# Patient Record
Sex: Male | Born: 1945 | Race: Black or African American | Hispanic: No | Marital: Married | Smoking: Former smoker
Health system: Southern US, Community
[De-identification: ages and names within clinical notes are randomized; demographics above are authoritative.]

## PROBLEM LIST (undated history)

## (undated) DIAGNOSIS — M549 Dorsalgia, unspecified: Secondary | ICD-10-CM

## (undated) DIAGNOSIS — E119 Type 2 diabetes mellitus without complications: Secondary | ICD-10-CM

## (undated) DIAGNOSIS — K219 Gastro-esophageal reflux disease without esophagitis: Secondary | ICD-10-CM

## (undated) DIAGNOSIS — I10 Essential (primary) hypertension: Secondary | ICD-10-CM

## (undated) DIAGNOSIS — L409 Psoriasis, unspecified: Secondary | ICD-10-CM

## (undated) DIAGNOSIS — E7800 Pure hypercholesterolemia, unspecified: Secondary | ICD-10-CM

## (undated) DIAGNOSIS — I2510 Atherosclerotic heart disease of native coronary artery without angina pectoris: Secondary | ICD-10-CM

## (undated) DIAGNOSIS — M1990 Unspecified osteoarthritis, unspecified site: Secondary | ICD-10-CM

## (undated) DIAGNOSIS — F4310 Post-traumatic stress disorder, unspecified: Secondary | ICD-10-CM

## (undated) HISTORY — DX: Unspecified osteoarthritis, unspecified site: M19.90

## (undated) HISTORY — DX: Gastro-esophageal reflux disease without esophagitis: K21.9

## (undated) HISTORY — PX: ELBOW SURGERY: SHX618

## (undated) HISTORY — DX: Psoriasis, unspecified: L40.9

## (undated) HISTORY — DX: Atherosclerotic heart disease of native coronary artery without angina pectoris: I25.10

## (undated) HISTORY — PX: WRIST SURGERY: SHX841

## (undated) HISTORY — DX: Post-traumatic stress disorder, unspecified: F43.10

---

## 1968-11-18 HISTORY — PX: FEMUR FRACTURE SURGERY: SHX633

## 2015-12-26 ENCOUNTER — Emergency Department (HOSPITAL_COMMUNITY): Payer: Medicare Other

## 2015-12-26 ENCOUNTER — Emergency Department (HOSPITAL_COMMUNITY)
Admission: EM | Admit: 2015-12-26 | Discharge: 2015-12-26 | Disposition: A | Discharge status: Home / Self Care | Payer: Medicare Other | Attending: Emergency Medicine | Admitting: Emergency Medicine

## 2015-12-26 ENCOUNTER — Encounter (HOSPITAL_COMMUNITY): Payer: Self-pay

## 2015-12-26 DIAGNOSIS — R05 Cough: Secondary | ICD-10-CM | POA: Diagnosis present

## 2015-12-26 DIAGNOSIS — Z791 Long term (current) use of non-steroidal anti-inflammatories (NSAID): Secondary | ICD-10-CM | POA: Insufficient documentation

## 2015-12-26 DIAGNOSIS — J069 Acute upper respiratory infection, unspecified: Secondary | ICD-10-CM | POA: Insufficient documentation

## 2015-12-26 DIAGNOSIS — R059 Cough, unspecified: Secondary | ICD-10-CM

## 2015-12-26 DIAGNOSIS — I10 Essential (primary) hypertension: Secondary | ICD-10-CM | POA: Insufficient documentation

## 2015-12-26 DIAGNOSIS — E119 Type 2 diabetes mellitus without complications: Secondary | ICD-10-CM | POA: Insufficient documentation

## 2015-12-26 DIAGNOSIS — Z79899 Other long term (current) drug therapy: Secondary | ICD-10-CM | POA: Insufficient documentation

## 2015-12-26 HISTORY — DX: Type 2 diabetes mellitus without complications: E11.9

## 2015-12-26 HISTORY — DX: Essential (primary) hypertension: I10

## 2015-12-26 HISTORY — DX: Dorsalgia, unspecified: M54.9

## 2015-12-26 LAB — BASIC METABOLIC PANEL
ANION GAP: 10 (ref 5–15)
BUN: 19 mg/dL (ref 6–20)
CHLORIDE: 100 mmol/L — AB (ref 101–111)
CO2: 29 mmol/L (ref 22–32)
Calcium: 9.7 mg/dL (ref 8.9–10.3)
Creatinine, Ser: 1.18 mg/dL (ref 0.61–1.24)
GFR calc non Af Amer: >60 mL/min (ref >60)
Glucose, Bld: 138 mg/dL — ABNORMAL HIGH (ref 65–99)
POTASSIUM: 4.4 mmol/L (ref 3.5–5.1)
SODIUM: 139 mmol/L (ref 135–145)

## 2015-12-26 LAB — CBC
HCT: 41.6 % (ref 39.0–52.0)
HEMOGLOBIN: 14.3 g/dL (ref 13.0–17.0)
MCH: 33.3 pg (ref 26.0–34.0)
MCHC: 34.4 g/dL (ref 30.0–36.0)
MCV: 97 fL (ref 78.0–100.0)
Platelets: 119 10*3/uL — ABNORMAL LOW (ref 150–400)
RBC: 4.29 MIL/uL (ref 4.22–5.81)
RDW: 12.6 % (ref 11.5–15.5)
WBC: 6.4 10*3/uL (ref 4.0–10.5)

## 2015-12-26 MED ORDER — HYDROCOD POLST-CPM POLST ER 10-8 MG/5ML PO SUER: 5.0000 mL | Freq: Two times a day (BID) | ORAL | Status: DC | PRN

## 2015-12-26 NOTE — ED Provider Notes (Signed)
CSN: [id number]     Arrival date & time 12/26/15  1191 History  By signing my name below, I, Murriel Hopper, attest that this documentation has been prepared under the direction and in the presence of Linwood Dibbles, MD. Electronically Signed: Murriel Hopper, ED Scribe. 12/26/2015. 10:10 AM.    Chief Complaint  Patient presents with  . Cough     Patient is a 70 y.o. male presenting with cough. The history is provided by the patient. No language interpreter was used.  Cough  HPI Comments: Chad Perry is a 70 y.o. male who presents to the Emergency Department complaining of a constant, productive cough that has been present since yesterday. Pt reports he also has associated body aches and chills. Pt states he had a flu shot this year. Pt denies taking any medication to treat his symptoms. Pt denies fever, ear pain, sore throat, vomiting, diarrhea.  Past Medical History  Diagnosis Date  . Diabetes mellitus without complication (HCC)   . Hypertension   . Back pain    Past Surgical History  Procedure Laterality Date  . Knee surgery    . Femur fracture surgery    . Elbow surgery    . Wrist surgery     No family history on file. Social History  Substance Use Topics  . Smoking status: Never Smoker   . Smokeless tobacco: None  . Alcohol Use: No    Review of Systems  Respiratory: Positive for cough.     A complete 10 system review of systems was obtained and all systems are negative except as noted in the HPI and PMH.    Allergies  Review of patient's allergies indicates no known allergies.  Home Medications   Prior to Admission medications   Medication Sig Start Date End Date Taking? Authorizing Provider  aspirin-sod bicarb-citric acid (ALKA-SELTZER) 325 MG TBEF tablet Take 325 mg by mouth every 6 (six) hours as needed.   Yes Historical Provider, MD  FLUoxetine (PROZAC) 20 MG capsule Take 20 mg by mouth daily.   Yes Historical Provider, MD  lisinopril (PRINIVIL,ZESTRIL) 40 MG tablet  Take 40 mg by mouth daily.   Yes Historical Provider, MD  metFORMIN (GLUCOPHAGE) 500 MG tablet Take 500 mg by mouth 1 day or 1 dose.   Yes Historical Provider, MD  omeprazole (PRILOSEC) 20 MG capsule Take 20 mg by mouth daily.   Yes Historical Provider, MD  sulindac (CLINORIL) 200 MG tablet Take 200 mg by mouth 2 (two) times daily.   Yes Historical Provider, MD  tamsulosin (FLOMAX) 0.4 MG CAPS capsule Take 0.4 mg by mouth.   Yes Historical Provider, MD  traMADol (ULTRAM) 50 MG tablet Take 50 mg by mouth every 6 (six) hours as needed.   Yes Historical Provider, MD  traZODone (DESYREL) 100 MG tablet Take 200 mg by mouth at bedtime.   Yes Historical Provider, MD  triamterene-hydrochlorothiazide (MAXZIDE) 75-50 MG tablet Take 0.5 tablets by mouth daily.   Yes Historical Provider, MD  chlorpheniramine-HYDROcodone (TUSSIONEX PENNKINETIC ER) 10-8 MG/5ML SUER Take 5 mLs by mouth every 12 (twelve) hours as needed for cough. 12/26/15   Linwood Dibbles, MD   BP 138/72 mmHg  Pulse 93  Temp(Src) 98.7 F (37.1 C) (Oral)  Resp 17  Ht  (1.676 m)  Wt 96.163 kg  BMI 34.23 kg/m2  SpO2 95% Physical Exam  Constitutional: He appears well-developed and well-nourished. No distress.  HENT:  Head: Normocephalic and atraumatic.  Right Ear: External ear normal.  Left Ear: External ear normal.  Eyes: Conjunctivae are normal. Right eye exhibits no discharge. Left eye exhibits no discharge. No scleral icterus.  Neck: Neck supple. No tracheal deviation present.  Cardiovascular: Normal rate, regular rhythm and intact distal pulses.   Pulmonary/Chest: Effort normal and breath sounds normal. No stridor. No respiratory distress. He has no wheezes. He has no rales.  Abdominal: Soft. Bowel sounds are normal. He exhibits no distension. There is no tenderness. There is no rebound and no guarding.  Musculoskeletal: He exhibits no edema or tenderness.  Neurological: He is alert. He has normal strength. No cranial nerve deficit  (no facial droop, extraocular movements intact, no slurred speech) or sensory deficit. He exhibits normal muscle tone. He displays no seizure activity. Coordination normal.  Skin: Skin is warm and dry. No rash noted.  Psychiatric: He has a normal mood and affect.  Nursing note and vitals reviewed.   ED Course  Procedures (including critical care time)  DIAGNOSTIC STUDIES: Oxygen Saturation is 95% on room air, normal by my interpretation.    COORDINATION OF CARE: 10:10 AM Discussed treatment plan with pt at bedside and pt agreed to plan.   Labs Review Labs Reviewed  CBC - Abnormal; Notable for the following:    Platelets 119 (*)    All other components within normal limits  BASIC METABOLIC PANEL - Abnormal; Notable for the following:    Chloride 100 (*)    Glucose, Bld 138 (*)    All other components within normal limits    Imaging Review Dg Chest 2 View  12/26/2015  CLINICAL DATA:  Cough. They all over as well as chills. History of hypertension. Former smoker. EXAM: CHEST  2 VIEW COMPARISON:  None. FINDINGS: Cardiac silhouette is normal in size and configuration. Normal mediastinal and hilar contours. Mild scarring noted in the left upper lobe lingula. Lungs are otherwise clear. No pleural effusion or pneumothorax. Bony thorax is intact. IMPRESSION: No active cardiopulmonary disease. Electronically Signed   By: Amie Portland M.D.   On: 12/26/2015 10:27   I have personally reviewed and evaluated these images and lab results as part of my medical decision-making.   MDM   Final diagnoses:  Cough  URI (upper respiratory infection)    Symptoms are most likely related to a viral infection. Patient appears well. Chest x-ray does not show any evidence of pneumonia. Laboratory tests are unremarkable.  Plan discharge home with a cough medication for supportive care. Follow up with his doctor next week if the symptoms persist. Return for  worsening symptoms or shortness of  breath   Linwood Dibbles, MD 12/26/15 1144

## 2015-12-26 NOTE — ED Notes (Signed)
Pt reports nonproductive cough, generalized body aches, and chills since yesterday.

## 2015-12-26 NOTE — Discharge Instructions (Signed)

## 2016-03-25 ENCOUNTER — Encounter (HOSPITAL_COMMUNITY): Payer: Self-pay | Admitting: Emergency Medicine

## 2016-03-25 ENCOUNTER — Emergency Department (HOSPITAL_COMMUNITY): Payer: Medicare Other

## 2016-03-25 ENCOUNTER — Emergency Department (HOSPITAL_COMMUNITY)
Admission: EM | Admit: 2016-03-25 | Discharge: 2016-03-25 | Disposition: A | Discharge status: Home / Self Care | Payer: Medicare Other | Attending: Emergency Medicine | Admitting: Emergency Medicine

## 2016-03-25 DIAGNOSIS — R51 Headache: Secondary | ICD-10-CM | POA: Diagnosis present

## 2016-03-25 DIAGNOSIS — E119 Type 2 diabetes mellitus without complications: Secondary | ICD-10-CM | POA: Insufficient documentation

## 2016-03-25 DIAGNOSIS — I10 Essential (primary) hypertension: Secondary | ICD-10-CM | POA: Diagnosis not present

## 2016-03-25 DIAGNOSIS — Z79899 Other long term (current) drug therapy: Secondary | ICD-10-CM | POA: Insufficient documentation

## 2016-03-25 DIAGNOSIS — Z7984 Long term (current) use of oral hypoglycemic drugs: Secondary | ICD-10-CM | POA: Insufficient documentation

## 2016-03-25 DIAGNOSIS — R519 Headache, unspecified: Secondary | ICD-10-CM

## 2016-03-25 DIAGNOSIS — Z7982 Long term (current) use of aspirin: Secondary | ICD-10-CM | POA: Diagnosis not present

## 2016-03-25 HISTORY — DX: Pure hypercholesterolemia, unspecified: E78.00

## 2016-03-25 LAB — COMPREHENSIVE METABOLIC PANEL
ALT: 33 U/L (ref 17–63)
AST: 36 U/L (ref 15–41)
Albumin: 4.2 g/dL (ref 3.5–5.0)
Alkaline Phosphatase: 54 U/L (ref 38–126)
Anion gap: 7 (ref 5–15)
BUN: 20 mg/dL (ref 6–20)
CHLORIDE: 105 mmol/L (ref 101–111)
CO2: 29 mmol/L (ref 22–32)
CREATININE: 1.38 mg/dL — AB (ref 0.61–1.24)
Calcium: 9.9 mg/dL (ref 8.9–10.3)
GFR, EST AFRICAN AMERICAN: 59 mL/min — AB (ref >60)
GFR, EST NON AFRICAN AMERICAN: 51 mL/min — AB (ref >60)
Glucose, Bld: 133 mg/dL — ABNORMAL HIGH (ref 65–99)
POTASSIUM: 4.6 mmol/L (ref 3.5–5.1)
SODIUM: 141 mmol/L (ref 135–145)
Total Bilirubin: 0.4 mg/dL (ref 0.3–1.2)
Total Protein: 7.8 g/dL (ref 6.5–8.1)

## 2016-03-25 LAB — CBC WITH DIFFERENTIAL/PLATELET
BASOS ABS: 0 10*3/uL (ref 0.0–0.1)
Basophils Relative: 1 %
EOS ABS: 0.1 10*3/uL (ref 0.0–0.7)
EOS PCT: 3 %
HCT: 40.6 % (ref 39.0–52.0)
Hemoglobin: 13.8 g/dL (ref 13.0–17.0)
LYMPHS PCT: 52 %
Lymphs Abs: 1.7 10*3/uL (ref 0.7–4.0)
MCH: 33 pg (ref 26.0–34.0)
MCHC: 34 g/dL (ref 30.0–36.0)
MCV: 97.1 fL (ref 78.0–100.0)
MONO ABS: 0.3 10*3/uL (ref 0.1–1.0)
Monocytes Relative: 8 %
Neutro Abs: 1.2 10*3/uL — ABNORMAL LOW (ref 1.7–7.7)
Neutrophils Relative %: 36 %
PLATELETS: 148 10*3/uL — AB (ref 150–400)
RBC: 4.18 MIL/uL — AB (ref 4.22–5.81)
RDW: 12 % (ref 11.5–15.5)
WBC: 3.3 10*3/uL — AB (ref 4.0–10.5)

## 2016-03-25 NOTE — ED Notes (Signed)
PT c/o headache for the past two days and states after church yesterday he was driving home and took a wrong turn and his wife stated he was confused yesterday and was nervous feeling but states he only had a bowl of cereal before church and is a diabetic. PT denies confusion today but states he still has a headache.

## 2016-03-25 NOTE — ED Provider Notes (Signed)
CSN: [id number]     Arrival date & time 03/25/16  1305 History   First MD Initiated Contact with Patient 03/25/16 1655     Chief Complaint  Patient presents with  . Headache     (Consider location/radiation/quality/duration/timing/severity/associated sxs/prior Treatment) HPI Complains of frontal headache gradual onset 2 weeks ago. Headache is mild. Nonradiating. He reports that he became confused while driving home from church yesterday afternoon, making a wrong turn while headed home. He reports that he was confused for approximately 2 hours. He checked his blood sugar upon arrival home which was 82. He drank orange juice and felt better. No other associated symptoms. No visual changes no focal numbness or weakness no difficulty with speech. No fever. Nothing makes symptoms better or worse he treated himself with Tylenol yesterday Past Medical History  Diagnosis Date  . Diabetes mellitus without complication (HCC)   . Hypertension   . Back pain   . High cholesterol    Past Surgical History  Procedure Laterality Date  . Knee surgery    . Femur fracture surgery    . Elbow surgery    . Wrist surgery     History reviewed. No pertinent family history. Social History  Substance Use Topics  . Smoking status: Never Smoker   . Smokeless tobacco: None  . Alcohol Use: No    Review of Systems  Constitutional: Negative.   Respiratory: Negative.   Cardiovascular: Negative.   Gastrointestinal: Negative.   Musculoskeletal: Negative.   Skin: Negative.   Allergic/Immunologic: Positive for immunocompromised state.       Diabetic  Neurological: Positive for headaches.  Psychiatric/Behavioral: Negative.       Allergies  Review of patient's allergies indicates no known allergies.  Home Medications   Prior to Admission medications   Medication Sig Start Date End Date Taking? Authorizing Provider  aspirin-sod bicarb-citric acid (ALKA-SELTZER) 325 MG TBEF tablet Take 325 mg by mouth  every 6 (six) hours as needed.    Historical Provider, MD  chlorpheniramine-HYDROcodone (TUSSIONEX PENNKINETIC ER) 10-8 MG/5ML SUER Take 5 mLs by mouth every 12 (twelve) hours as needed for cough. 12/26/15   Linwood Dibbles, MD  FLUoxetine (PROZAC) 20 MG capsule Take 20 mg by mouth daily.    Historical Provider, MD  lisinopril (PRINIVIL,ZESTRIL) 40 MG tablet Take 40 mg by mouth daily.    Historical Provider, MD  metFORMIN (GLUCOPHAGE) 500 MG tablet Take 500 mg by mouth 1 day or 1 dose.    Historical Provider, MD  omeprazole (PRILOSEC) 20 MG capsule Take 20 mg by mouth daily.    Historical Provider, MD  sulindac (CLINORIL) 200 MG tablet Take 200 mg by mouth 2 (two) times daily.    Historical Provider, MD  tamsulosin (FLOMAX) 0.4 MG CAPS capsule Take 0.4 mg by mouth.    Historical Provider, MD  traMADol (ULTRAM) 50 MG tablet Take 50 mg by mouth every 6 (six) hours as needed.    Historical Provider, MD  traZODone (DESYREL) 100 MG tablet Take 200 mg by mouth at bedtime.    Historical Provider, MD  triamterene-hydrochlorothiazide (MAXZIDE) 75-50 MG tablet Take 0.5 tablets by mouth daily.    Historical Provider, MD   BP 146/78 mmHg  Pulse 70  Temp(Src) 97.6 F (36.4 C) (Oral)  Resp 18  Ht  (1.651 m)  Wt 192 lb (87.091 kg)  BMI 31.95 kg/m2  SpO2 96% Physical Exam  Constitutional: He is oriented to person, place, and time. He appears well-developed and well-nourished.  No distress.  HENT:  Head: Normocephalic and atraumatic.  Eyes: Conjunctivae are normal. Pupils are equal, round, and reactive to light.  Neck: Neck supple. No tracheal deviation present. No thyromegaly present.  Cardiovascular: Normal rate and regular rhythm.   No murmur heard. Pulmonary/Chest: Effort normal and breath sounds normal.  Abdominal: Soft. Bowel sounds are normal. He exhibits no distension. There is no tenderness.  Musculoskeletal: Normal range of motion. He exhibits no edema or tenderness.  Neurological: He is alert  and oriented to person, place, and time. He has normal reflexes. No cranial nerve deficit. Coordination normal.  Gait normal Romberg normal pronator drift normal DTRs symmetric bilaterally at knee jerk ankle jerk and biceps toes downward going bilaterally finger to nose normal  Skin: Skin is warm and dry. No rash noted.  Psychiatric: He has a normal mood and affect.  Nursing note and vitals reviewed.   ED Course  Procedures (including critical care time) Labs Review Labs Reviewed  CBC WITH DIFFERENTIAL/PLATELET - Abnormal; Notable for the following:    WBC 3.3 (*)    RBC 4.18 (*)    Platelets 148 (*)    Neutro Abs 1.2 (*)    All other components within normal limits  COMPREHENSIVE METABOLIC PANEL - Abnormal; Notable for the following:    Glucose, Bld 133 (*)    Creatinine, Ser 1.38 (*)    GFR calc non Af Amer 51 (*)    GFR calc Af Amer 59 (*)    All other components within normal limits    Imaging Review No results found. I have personally reviewed and evaluated these images and lab results as part of my medical decision-making.   EKG Interpretation None     6:50 PM patient resting comfortably. No distress states "I was napping". He is presently alert, Glasgow Coma Score 15. MDM  Headache is felt to be nonspecific. Doubt stroke based on transient confusion. No history of focal neurologic deficit and normal exam Plan [facility] Final diagnoses:  None  He he is urged to get his renal function rechecked within the next 2 weeks Diagnoses #1 nonspecific headache #2 renal insufficiency      Doug SouSam Cali Hope, MD 03/25/16 1901

## 2016-03-25 NOTE — Discharge Instructions (Signed)
General Headache Without Cause CT scan of your brain was normal today. Your kidney function is slightly abnormal with BUN 20, creatinine 1.38. [facility] your doctor at the [facility] to recheck your kidney function in 2 weeks. It is okay to take Tylenol for pain. A headache is pain or discomfort felt around the head or neck area. There are many causes and types of headaches. In some cases, the cause may not be found.  HOME CARE  Managing Pain  Take over-the-counter and prescription medicines only as told by your doctor.  Lie down in a dark, quiet room when you have a headache.  If directed, apply ice to the head and neck area:  Put ice in a plastic bag.  Place a towel between your skin and the bag.  Leave the ice on for 20 minutes, 2-3 times per day.  Use a heating pad or hot shower to apply heat to the head and neck area as told by your doctor.  Keep lights dim if bright lights bother you or make your headaches worse. Eating and Drinking  Eat meals on a regular schedule.  Lessen how much alcohol you drink.  Lessen how much caffeine you drink, or stop drinking caffeine. General Instructions  Keep all follow-up visits as told by your doctor. This is important.  Keep a journal to find out if certain things bring on headaches. For example, write down:  What you eat and drink.  How much sleep you get.  Any change to your diet or medicines.  Relax by getting a massage or doing other relaxing activities.  Lessen stress.  Sit up straight. Do not tighten (tense) your muscles.  Do not use tobacco products. This includes cigarettes, chewing tobacco, or e-cigarettes. If you need help quitting, ask your doctor.  Exercise regularly as told by your doctor.  Get enough sleep. This often means 7-9 hours of sleep. GET HELP IF:  Your symptoms are not helped by medicine.  You have a headache that feels different than the other headaches.  You feel sick to your stomach (nauseous) or  you throw up (vomit).  You have a fever. GET HELP RIGHT AWAY IF:   Your headache becomes really bad.  You keep throwing up.  You have a stiff neck.  You have trouble seeing.  You have trouble speaking.  You have pain in the eye or ear.  Your muscles are weak or you lose muscle control.  You lose your balance or have trouble walking.  You feel like you will pass out (faint) or you pass out.  You have confusion.   This information is not intended to replace advice given to you by your health care provider. Make sure you discuss any questions you have with your health care provider.   Document Released: 08/13/2008 Document Revised: 07/26/2015 Document Reviewed: 02/27/2015 Elsevier Interactive Patient Education [employer]

## 2016-07-04 DIAGNOSIS — M25521 Pain in right elbow: Secondary | ICD-10-CM | POA: Insufficient documentation

## 2016-11-18 HISTORY — PX: CARDIAC SURGERY: SHX584

## 2016-12-09 DIAGNOSIS — M6281 Muscle weakness (generalized): Secondary | ICD-10-CM | POA: Diagnosis not present

## 2016-12-09 DIAGNOSIS — E119 Type 2 diabetes mellitus without complications: Secondary | ICD-10-CM | POA: Diagnosis not present

## 2016-12-09 DIAGNOSIS — I2510 Atherosclerotic heart disease of native coronary artery without angina pectoris: Secondary | ICD-10-CM | POA: Diagnosis not present

## 2016-12-12 DIAGNOSIS — M6281 Muscle weakness (generalized): Secondary | ICD-10-CM | POA: Diagnosis not present

## 2016-12-12 DIAGNOSIS — I2510 Atherosclerotic heart disease of native coronary artery without angina pectoris: Secondary | ICD-10-CM | POA: Diagnosis not present

## 2016-12-12 DIAGNOSIS — E119 Type 2 diabetes mellitus without complications: Secondary | ICD-10-CM | POA: Diagnosis not present

## 2016-12-16 DIAGNOSIS — I2510 Atherosclerotic heart disease of native coronary artery without angina pectoris: Secondary | ICD-10-CM | POA: Diagnosis not present

## 2016-12-16 DIAGNOSIS — M6281 Muscle weakness (generalized): Secondary | ICD-10-CM | POA: Diagnosis not present

## 2016-12-16 DIAGNOSIS — E119 Type 2 diabetes mellitus without complications: Secondary | ICD-10-CM | POA: Diagnosis not present

## 2016-12-19 DIAGNOSIS — I2510 Atherosclerotic heart disease of native coronary artery without angina pectoris: Secondary | ICD-10-CM | POA: Diagnosis not present

## 2016-12-19 DIAGNOSIS — M6281 Muscle weakness (generalized): Secondary | ICD-10-CM | POA: Diagnosis not present

## 2016-12-19 DIAGNOSIS — E119 Type 2 diabetes mellitus without complications: Secondary | ICD-10-CM | POA: Diagnosis not present

## 2016-12-24 DIAGNOSIS — I2510 Atherosclerotic heart disease of native coronary artery without angina pectoris: Secondary | ICD-10-CM | POA: Diagnosis not present

## 2016-12-24 DIAGNOSIS — M6281 Muscle weakness (generalized): Secondary | ICD-10-CM | POA: Diagnosis not present

## 2016-12-24 DIAGNOSIS — E119 Type 2 diabetes mellitus without complications: Secondary | ICD-10-CM | POA: Diagnosis not present

## 2016-12-26 DIAGNOSIS — M6281 Muscle weakness (generalized): Secondary | ICD-10-CM | POA: Diagnosis not present

## 2016-12-26 DIAGNOSIS — I2510 Atherosclerotic heart disease of native coronary artery without angina pectoris: Secondary | ICD-10-CM | POA: Diagnosis not present

## 2016-12-26 DIAGNOSIS — E119 Type 2 diabetes mellitus without complications: Secondary | ICD-10-CM | POA: Diagnosis not present

## 2016-12-31 DIAGNOSIS — I2510 Atherosclerotic heart disease of native coronary artery without angina pectoris: Secondary | ICD-10-CM | POA: Diagnosis not present

## 2016-12-31 DIAGNOSIS — E119 Type 2 diabetes mellitus without complications: Secondary | ICD-10-CM | POA: Diagnosis not present

## 2016-12-31 DIAGNOSIS — M6281 Muscle weakness (generalized): Secondary | ICD-10-CM | POA: Diagnosis not present

## 2017-01-02 DIAGNOSIS — M6281 Muscle weakness (generalized): Secondary | ICD-10-CM | POA: Diagnosis not present

## 2017-01-02 DIAGNOSIS — E119 Type 2 diabetes mellitus without complications: Secondary | ICD-10-CM | POA: Diagnosis not present

## 2017-01-02 DIAGNOSIS — I2510 Atherosclerotic heart disease of native coronary artery without angina pectoris: Secondary | ICD-10-CM | POA: Diagnosis not present

## 2017-01-07 DIAGNOSIS — I2510 Atherosclerotic heart disease of native coronary artery without angina pectoris: Secondary | ICD-10-CM | POA: Diagnosis not present

## 2017-01-07 DIAGNOSIS — E119 Type 2 diabetes mellitus without complications: Secondary | ICD-10-CM | POA: Diagnosis not present

## 2017-01-07 DIAGNOSIS — I10 Essential (primary) hypertension: Secondary | ICD-10-CM | POA: Diagnosis not present

## 2017-01-07 DIAGNOSIS — M6281 Muscle weakness (generalized): Secondary | ICD-10-CM | POA: Diagnosis not present

## 2017-01-07 DIAGNOSIS — Z0130 Encounter for examination of blood pressure without abnormal findings: Secondary | ICD-10-CM | POA: Diagnosis not present

## 2017-01-25 IMAGING — DX DG CHEST 2V
2 series · 2 of 2 positions shown · non-contrast
Comparison: None.

CLINICAL DATA: Cough. They all over as well as chills. History of
hypertension. Former smoker.

EXAM:
CHEST  2 VIEW

[chest pa]
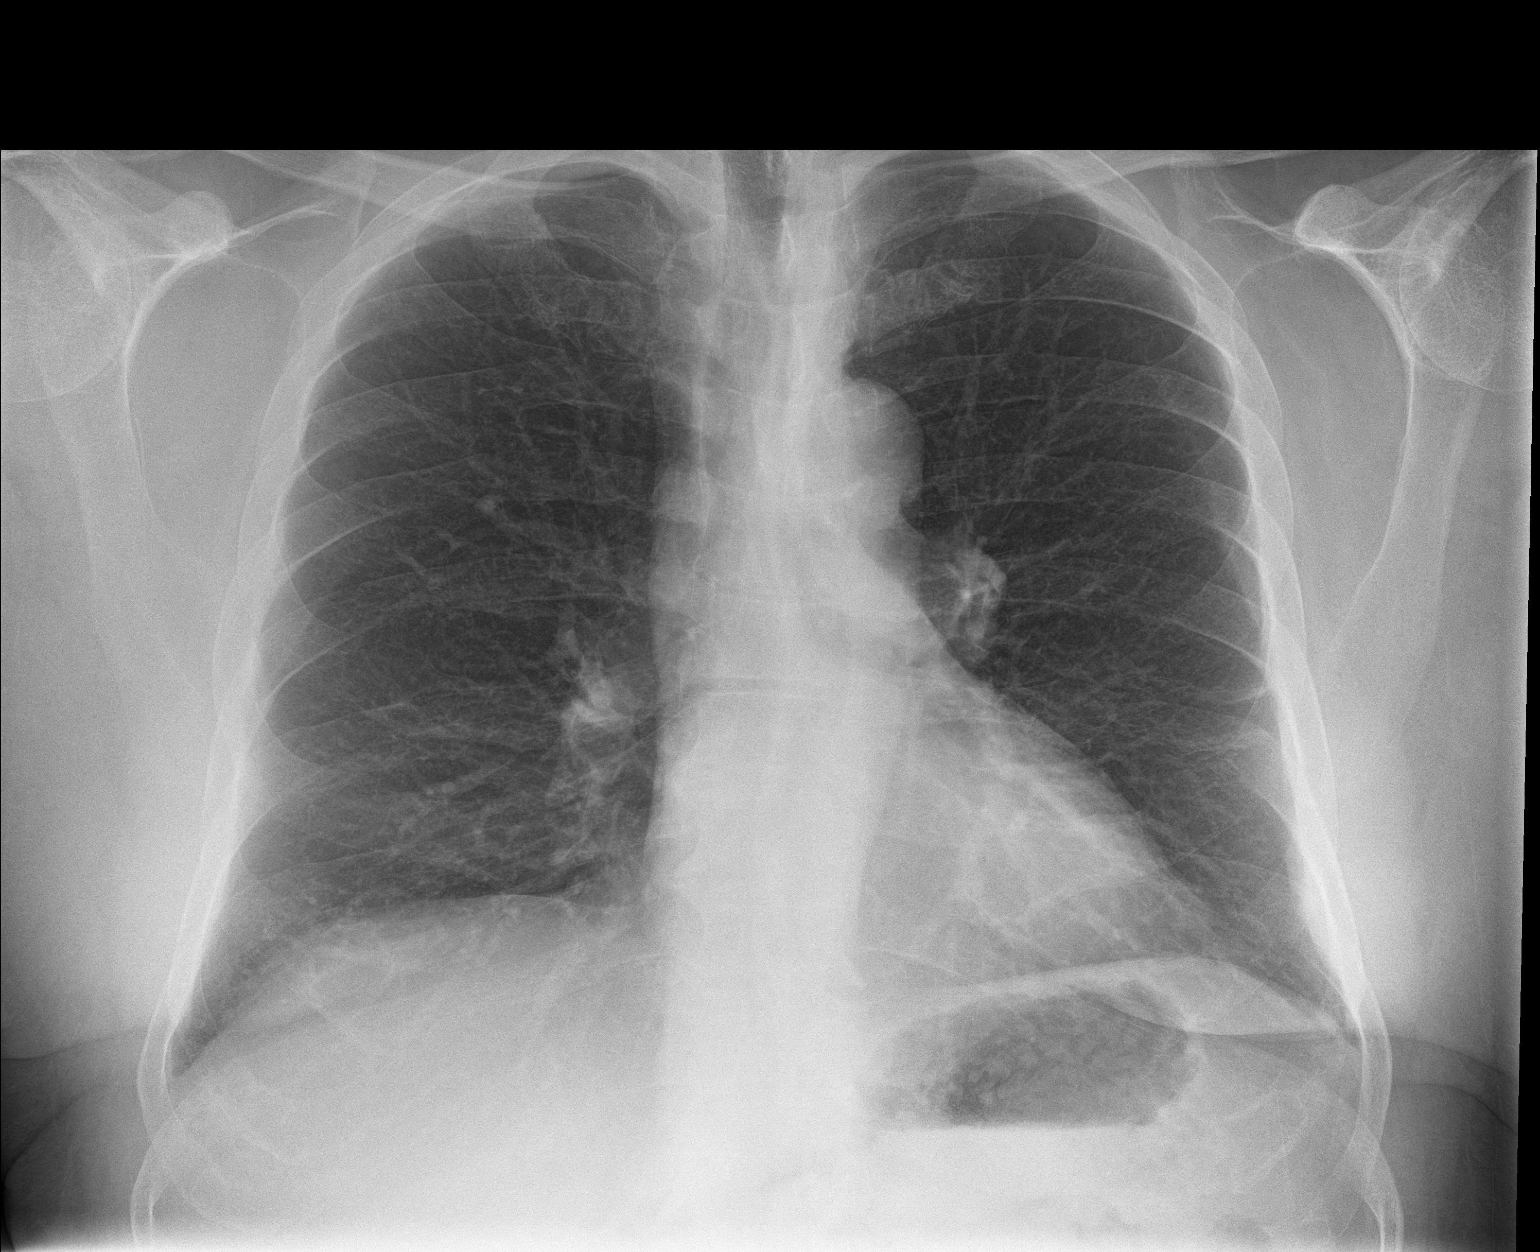

[chest lat]
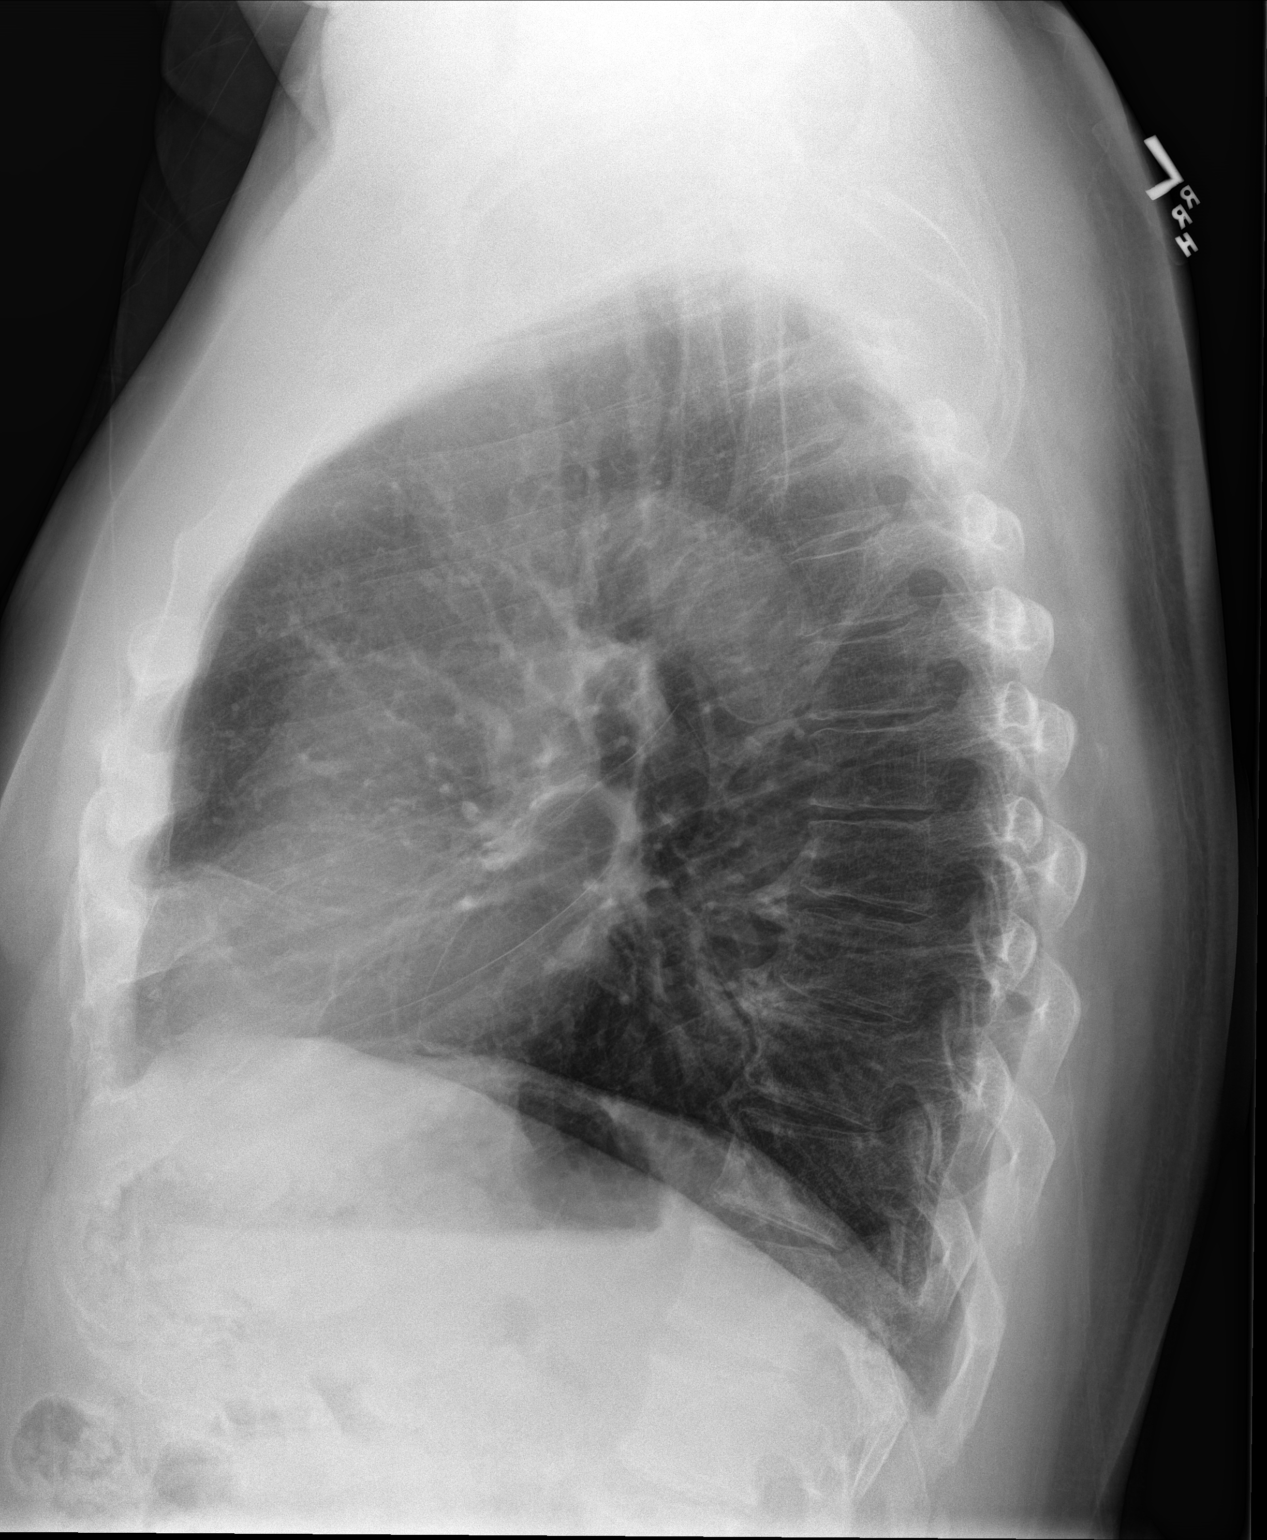

[2 of 2 positions shown; findings below may reference images not displayed]

FINDINGS: Cardiac silhouette is normal in size and configuration. Normal
mediastinal and hilar contours. Mild scarring noted in the left
upper lobe lingula. Lungs are otherwise clear. No pleural effusion
or pneumothorax.

Bony thorax is intact.
IMPRESSION: No active cardiopulmonary disease.

## 2017-12-11 DIAGNOSIS — M545 Low back pain: Secondary | ICD-10-CM | POA: Diagnosis not present

## 2017-12-11 DIAGNOSIS — Z87891 Personal history of nicotine dependence: Secondary | ICD-10-CM | POA: Diagnosis not present

## 2017-12-11 DIAGNOSIS — E119 Type 2 diabetes mellitus without complications: Secondary | ICD-10-CM | POA: Diagnosis not present

## 2017-12-11 DIAGNOSIS — I10 Essential (primary) hypertension: Secondary | ICD-10-CM | POA: Diagnosis not present

## 2018-02-17 HISTORY — PX: KNEE SURGERY: SHX244

## 2018-03-30 DIAGNOSIS — T8453XA Infection and inflammatory reaction due to internal right knee prosthesis, initial encounter: Secondary | ICD-10-CM | POA: Diagnosis not present

## 2018-04-01 DIAGNOSIS — T8453XA Infection and inflammatory reaction due to internal right knee prosthesis, initial encounter: Secondary | ICD-10-CM | POA: Diagnosis not present

## 2018-04-06 DIAGNOSIS — T8453XA Infection and inflammatory reaction due to internal right knee prosthesis, initial encounter: Secondary | ICD-10-CM | POA: Diagnosis not present

## 2018-04-09 DIAGNOSIS — T8453XA Infection and inflammatory reaction due to internal right knee prosthesis, initial encounter: Secondary | ICD-10-CM | POA: Diagnosis not present

## 2018-04-17 DIAGNOSIS — T8453XA Infection and inflammatory reaction due to internal right knee prosthesis, initial encounter: Secondary | ICD-10-CM | POA: Diagnosis not present

## 2018-09-03 ENCOUNTER — Encounter: Payer: Self-pay | Admitting: *Deleted

## 2018-09-07 ENCOUNTER — Encounter: Payer: Self-pay | Admitting: Diagnostic Neuroimaging

## 2018-09-07 ENCOUNTER — Ambulatory Visit (INDEPENDENT_AMBULATORY_CARE_PROVIDER_SITE_OTHER): Payer: Medicare Other | Admitting: Diagnostic Neuroimaging

## 2018-09-07 VITALS — BP 136/75 | HR 56 | Ht 66.0 in | Wt 204.8 lb

## 2018-09-07 DIAGNOSIS — R269 Unspecified abnormalities of gait and mobility: Secondary | ICD-10-CM | POA: Diagnosis not present

## 2018-09-07 DIAGNOSIS — R42 Dizziness and giddiness: Secondary | ICD-10-CM | POA: Diagnosis not present

## 2018-09-07 NOTE — Progress Notes (Signed)
[facility]  PATIENT: Chad Perry DOB: 08/21/1946  REFERRING CLINICIAN: Barnie Alderman, DO HISTORY FROM: patient  REASON FOR VISIT: new consult    HISTORICAL  CHIEF COMPLAINT:  Chief Complaint  Patient presents with  . Dizziness    rm 6, New Pt, wife- Chad Perry, "dizziness during the day for > 1 month, has led to a few falls"    HISTORY OF PRESENT ILLNESS:   72 year old male here for evaluation of dizziness and syncope.  History of diabetes, hypercholesteremia, heart disease, depression, anxiety.  Since 2015 patient has had intermittent episodes of lightheadedness and dizziness.  Sometimes he feels off balance.  Symptoms of continued and progressively worsen since his open heart surgery in 2018.  He also had right knee replacement in April 2019.  Patient had severe dizziness and syncope attack sometime in June or July 2019 and was evaluated.  He was recommended to have follow-up in cardiology and neurology clinic.  Patient continues to have lightheadedness episodes when he stands up.  Wife also notes malaise, fatigue, memory loss issues.   REVIEW OF SYSTEMS: Full 14 system review of systems performed and negative with exception of: Memory loss confusion dizziness depression disinterest activities impotence ringing in ears spinning sensation snoring shortness of breath fevers chills.   ALLERGIES: No Known Allergies  HOME MEDICATIONS: Outpatient Medications Prior to Visit  Medication Sig Dispense Refill  . aspirin EC 81 MG tablet Take 324 mg by mouth every morning.    Marland Kitchen aspirin-sod bicarb-citric acid (ALKA-SELTZER) 325 MG TBEF tablet Take 325 mg by mouth every 6 (six) hours as needed.    Marland Kitchen FLUoxetine (PROZAC) 20 MG capsule Take 20 mg by mouth daily.    . hydrALAZINE (APRESOLINE) 50 MG tablet Take 50 mg by mouth 3 (three) times daily.    . hydrochlorothiazide (HYDRODIURIL) 12.5 MG tablet Take 12.5 mg by mouth daily.    Marland Kitchen losartan (COZAAR) 100 MG tablet Take 100  mg by mouth daily.    . metFORMIN (GLUCOPHAGE) 500 MG tablet Take 500 mg by mouth daily with breakfast.     . omeprazole (PRILOSEC) 20 MG capsule Take 20 mg by mouth daily.    Marland Kitchen sulfamethoxazole-trimethoprim (BACTRIM DS,SEPTRA DS) 800-160 MG tablet Take 1 tablet by mouth 2 (two) times daily.    . tamsulosin (FLOMAX) 0.4 MG CAPS capsule Take 0.4 mg by mouth daily.     . traMADol (ULTRAM) 50 MG tablet Take 50 mg by mouth every 6 (six) hours as needed for moderate pain.     . traZODone (DESYREL) 100 MG tablet Take 100 mg by mouth at bedtime.     Marland Kitchen lisinopril (PRINIVIL,ZESTRIL) 40 MG tablet Take 40 mg by mouth daily.    . sulindac (CLINORIL) 200 MG tablet Take 200 mg by mouth 2 (two) times daily.    Marland Kitchen triamterene-hydrochlorothiazide (MAXZIDE) 75-50 MG tablet Take 0.5 tablets by mouth daily.     No facility-administered medications prior to visit.     PAST MEDICAL HISTORY: Past Medical History:  Diagnosis Date  . Back pain   . CAD (coronary artery disease)   . Diabetes mellitus without complication (HCC)    type 2  . GERD (gastroesophageal reflux disease)   . High cholesterol   . Hypertension   . Osteoarthritis    knee  . Psoriasis   . PTSD (post-traumatic stress disorder)     PAST SURGICAL HISTORY: Past Surgical History:  Procedure Laterality Date  . CARDIAC SURGERY  11/2016  triple bypass  . ELBOW SURGERY    . FEMUR FRACTURE SURGERY Right 1970  . KNEE SURGERY Right 02/17/2018   replacement  . WRIST SURGERY      FAMILY HISTORY: Family History  Problem Relation Age of Onset  . Prostate cancer Father   . Diabetes Sister     SOCIAL HISTORY: Social History   Socioeconomic History  . Marital status: Married    Spouse name: Chad Perry  . Number of children: 3  . Years of education: 8  . Highest education level: Not on file  Occupational History    Comment: retired  Engineer, production  . Financial resource strain: Not on file  . Food insecurity:    Worry: Not on file     Inability: Not on file  . Transportation needs:    Medical: Not on file    Non-medical: Not on file  Tobacco Use  . Smoking status: Former Games developer  . Smokeless tobacco: Never Used  . Tobacco comment: quit 1995  Substance and Sexual Activity  . Alcohol use: No  . Drug use: No    Comment: quit 1995  . Sexual activity: Not on file  Lifestyle  . Physical activity:    Days per week: Not on file    Minutes per session: Not on file  . Stress: Not on file  Relationships  . Social connections:    Talks on phone: Not on file    Gets together: Not on file    Attends religious service: Not on file    Active member of club or organization: Not on file    Attends meetings of clubs or organizations: Not on file    Relationship status: Not on file  . Intimate partner violence:    Fear of current or ex partner: Not on file    Emotionally abused: Not on file    Physically abused: Not on file    Forced sexual activity: Not on file  Other Topics Concern  . Not on file  Social History Narrative   lives with wife   Caffeine= none     PHYSICAL EXAM  GENERAL EXAM/CONSTITUTIONAL: Vitals:  Vitals:   09/07/18 0959  BP: 136/75  Pulse: (!) 56  Weight: 204 lb 12.8 oz (92.9 kg)  Height: 5\' 6"  (1.676 m)     Body mass index is 33.06 kg/m. Wt Readings from Last 3 Encounters:  09/07/18 204 lb 12.8 oz (92.9 kg)  03/25/16 192 lb (87.1 kg)  12/26/15 212 lb (96.2 kg)     Patient is in no distress; well developed, nourished and groomed; neck is supple  CARDIOVASCULAR:  Examination of carotid arteries is normal; no carotid bruits  Regular rate and rhythm, no murmurs  Examination of peripheral vascular system by observation and palpation is normal  EYES:  Ophthalmoscopic exam of optic discs and posterior segments is normal; no papilledema or hemorrhages  Visual Acuity Screening   Right eye Left eye Both eyes  Without correction:     With correction: 20/30 20/30       MUSCULOSKELETAL:  Gait, strength, tone, movements noted in Neurologic exam below  NEUROLOGIC: MENTAL STATUS:  No flowsheet data found.  awake, alert, oriented to person, place and time  recent and remote memory intact  normal attention and concentration  language fluent, comprehension intact, naming intact  fund of knowledge appropriate  CRANIAL NERVE:   2nd - no papilledema on fundoscopic exam  2nd, 3rd, 4th, 6th - pupils equal and reactive  to light, visual fields full to confrontation, extraocular muscles intact, no nystagmus  5th - facial sensation symmetric  7th - facial strength symmetric  8th - hearing intact  9th - palate elevates symmetrically, uvula midline  11th - shoulder shrug symmetric  12th - tongue protrusion midline  MOTOR:   normal bulk and tone, full strength in the BUE, BLE  SENSORY:   normal and symmetric to light touch, temperature, vibration  COORDINATION:   finger-nose-finger, fine finger movements normal  REFLEXES:   deep tendon reflexes TRACE and symmetric  GAIT/STATION:   narrow based gait     DIAGNOSTIC DATA (LABS, IMAGING, TESTING) - I reviewed patient records, labs, notes, testing and imaging myself where available.  Lab Results  Component Value Date   WBC 3.3 (L) 03/25/2016   HGB 13.8 03/25/2016   HCT 40.6 03/25/2016   MCV 97.1 03/25/2016   PLT 148 (L) 03/25/2016      Component Value Date/Time   NA 141 03/25/2016 1341   K 4.6 03/25/2016 1341   CL 105 03/25/2016 1341   CO2 29 03/25/2016 1341   GLUCOSE 133 (H) 03/25/2016 1341   BUN 20 03/25/2016 1341   CREATININE 1.38 (H) 03/25/2016 1341   CALCIUM 9.9 03/25/2016 1341   PROT 7.8 03/25/2016 1341   ALBUMIN 4.2 03/25/2016 1341   AST 36 03/25/2016 1341   ALT 33 03/25/2016 1341   ALKPHOS 54 03/25/2016 1341   BILITOT 0.4 03/25/2016 1341   GFRNONAA 51 (L) 03/25/2016 1341   GFRAA 59 (L) 03/25/2016 1341   No results found for: CHOL, HDL, LDLCALC,  LDLDIRECT, TRIG, CHOLHDL No results found for: ZOXW9U No results found for: VITAMINB12 No results found for: TSH   03/25/16 CT head [I reviewed images myself and agree with interpretation. -VRP]  - negative    ASSESSMENT AND PLAN  72 y.o. year old male here with intermittent lightheadedness and dizziness, with orthostatic hypotension on vitals today.  Also with gait and balance difficulty.  Symptoms started in 2015 but have worsened since his open heart surgery in 2018.   Dx:  1. Lightheadedness   2. Gait difficulty      PLAN:  LIGHTHEADEDNESS / ORTHOSTATIC HYPOTENSION - maintain adequate hydration - monitor BP and sugar with any future attacks - follow up with PCP and cardiology - caution with driving  BALANCE DIFFICULTY (fear of falling, cautious) - follow up prior MRI brain results and prior discharge summary (will request records today) - consider PT for gait training - use cane as needed  Return if symptoms worsen or fail to improve, for return to PCP.    Suanne Marker, MD 09/07/2018, 10:47 AM Certified in Neurology, Neurophysiology and Neuroimaging  [facility] [address], [address] [place], [place] [address] [phone]

## 2018-09-07 NOTE — Patient Instructions (Signed)
  LIGHTHEADEDNESS / ORTHOSTATIC HYPOTENSION - maintain adequate hydration - monitor BP and sugar with any future attacks - follow up with PCP and cardiology - caution with driving  BALANCE DIFFICULTY (fear of falling, cautious) - follow up prior MRI brain results (will request records) - consider PT for gait training - use cane as needed

## 2020-02-11 DIAGNOSIS — I25110 Atherosclerotic heart disease of native coronary artery with unstable angina pectoris: Secondary | ICD-10-CM | POA: Diagnosis not present

## 2020-02-11 DIAGNOSIS — F329 Major depressive disorder, single episode, unspecified: Secondary | ICD-10-CM | POA: Diagnosis not present

## 2020-02-11 DIAGNOSIS — I160 Hypertensive urgency: Secondary | ICD-10-CM | POA: Diagnosis not present

## 2020-02-11 DIAGNOSIS — I110 Hypertensive heart disease with heart failure: Secondary | ICD-10-CM | POA: Diagnosis not present

## 2020-02-11 DIAGNOSIS — E119 Type 2 diabetes mellitus without complications: Secondary | ICD-10-CM | POA: Diagnosis not present

## 2020-02-11 DIAGNOSIS — Z7984 Long term (current) use of oral hypoglycemic drugs: Secondary | ICD-10-CM | POA: Diagnosis not present

## 2020-02-11 DIAGNOSIS — I5032 Chronic diastolic (congestive) heart failure: Secondary | ICD-10-CM | POA: Diagnosis not present

## 2020-02-11 DIAGNOSIS — Z7982 Long term (current) use of aspirin: Secondary | ICD-10-CM | POA: Diagnosis not present

## 2020-02-11 DIAGNOSIS — E785 Hyperlipidemia, unspecified: Secondary | ICD-10-CM | POA: Diagnosis not present

## 2020-03-28 DIAGNOSIS — E119 Type 2 diabetes mellitus without complications: Secondary | ICD-10-CM | POA: Diagnosis not present

## 2020-03-28 DIAGNOSIS — G4733 Obstructive sleep apnea (adult) (pediatric): Secondary | ICD-10-CM | POA: Diagnosis not present

## 2020-03-28 DIAGNOSIS — I10 Essential (primary) hypertension: Secondary | ICD-10-CM | POA: Diagnosis not present

## 2020-03-28 DIAGNOSIS — Z951 Presence of aortocoronary bypass graft: Secondary | ICD-10-CM | POA: Diagnosis not present

## 2020-03-28 DIAGNOSIS — R0600 Dyspnea, unspecified: Secondary | ICD-10-CM | POA: Diagnosis not present

## 2020-03-28 DIAGNOSIS — R079 Chest pain, unspecified: Secondary | ICD-10-CM | POA: Diagnosis not present

## 2020-03-28 DIAGNOSIS — E782 Mixed hyperlipidemia: Secondary | ICD-10-CM | POA: Diagnosis not present

## 2020-03-28 DIAGNOSIS — I2510 Atherosclerotic heart disease of native coronary artery without angina pectoris: Secondary | ICD-10-CM | POA: Diagnosis not present

## 2020-03-28 DIAGNOSIS — R001 Bradycardia, unspecified: Secondary | ICD-10-CM | POA: Diagnosis not present

## 2020-09-06 ENCOUNTER — Emergency Department (HOSPITAL_COMMUNITY): Payer: No Typology Code available for payment source

## 2020-09-06 ENCOUNTER — Encounter (HOSPITAL_COMMUNITY): Payer: Self-pay | Admitting: Emergency Medicine

## 2020-09-06 ENCOUNTER — Other Ambulatory Visit: Payer: Self-pay

## 2020-09-06 ENCOUNTER — Emergency Department (HOSPITAL_COMMUNITY)
Admission: EM | Admit: 2020-09-06 | Discharge: 2020-09-06 | Disposition: A | Discharge status: Home / Self Care | Payer: No Typology Code available for payment source | Attending: Emergency Medicine | Admitting: Emergency Medicine

## 2020-09-06 DIAGNOSIS — Z955 Presence of coronary angioplasty implant and graft: Secondary | ICD-10-CM | POA: Diagnosis not present

## 2020-09-06 DIAGNOSIS — Z79899 Other long term (current) drug therapy: Secondary | ICD-10-CM | POA: Diagnosis not present

## 2020-09-06 DIAGNOSIS — Z7984 Long term (current) use of oral hypoglycemic drugs: Secondary | ICD-10-CM | POA: Insufficient documentation

## 2020-09-06 DIAGNOSIS — R0789 Other chest pain: Secondary | ICD-10-CM | POA: Diagnosis not present

## 2020-09-06 DIAGNOSIS — Z87891 Personal history of nicotine dependence: Secondary | ICD-10-CM | POA: Diagnosis not present

## 2020-09-06 DIAGNOSIS — R0602 Shortness of breath: Secondary | ICD-10-CM

## 2020-09-06 DIAGNOSIS — Z7982 Long term (current) use of aspirin: Secondary | ICD-10-CM | POA: Diagnosis not present

## 2020-09-06 DIAGNOSIS — I2510 Atherosclerotic heart disease of native coronary artery without angina pectoris: Secondary | ICD-10-CM | POA: Insufficient documentation

## 2020-09-06 DIAGNOSIS — I10 Essential (primary) hypertension: Secondary | ICD-10-CM | POA: Diagnosis not present

## 2020-09-06 DIAGNOSIS — E119 Type 2 diabetes mellitus without complications: Secondary | ICD-10-CM | POA: Insufficient documentation

## 2020-09-06 LAB — COMPREHENSIVE METABOLIC PANEL
ALT: 28 U/L (ref 0–44)
AST: 34 U/L (ref 15–41)
Albumin: 4 g/dL (ref 3.5–5.0)
Alkaline Phosphatase: 57 U/L (ref 38–126)
Anion gap: 8 (ref 5–15)
BUN: 22 mg/dL (ref 8–23)
CO2: 26 mmol/L (ref 22–32)
Calcium: 9.7 mg/dL (ref 8.9–10.3)
Chloride: 97 mmol/L — ABNORMAL LOW (ref 98–111)
Creatinine, Ser: 1.53 mg/dL — ABNORMAL HIGH (ref 0.61–1.24)
GFR, Estimated: 45 mL/min — ABNORMAL LOW (ref >60)
Glucose, Bld: 83 mg/dL (ref 70–99)
Potassium: 4 mmol/L (ref 3.5–5.1)
Sodium: 131 mmol/L — ABNORMAL LOW (ref 135–145)
Total Bilirubin: 1.1 mg/dL (ref 0.3–1.2)
Total Protein: 7.9 g/dL (ref 6.5–8.1)

## 2020-09-06 LAB — CBC WITH DIFFERENTIAL/PLATELET
Abs Immature Granulocytes: 0 10*3/uL (ref 0.00–0.07)
Basophils Absolute: 0 10*3/uL (ref 0.0–0.1)
Basophils Relative: 0 %
Eosinophils Absolute: 0.1 10*3/uL (ref 0.0–0.5)
Eosinophils Relative: 2 %
HCT: 37.1 % — ABNORMAL LOW (ref 39.0–52.0)
Hemoglobin: 12.6 g/dL — ABNORMAL LOW (ref 13.0–17.0)
Immature Granulocytes: 0 %
Lymphocytes Relative: 46 %
Lymphs Abs: 2.2 10*3/uL (ref 0.7–4.0)
MCH: 33.3 pg (ref 26.0–34.0)
MCHC: 34 g/dL (ref 30.0–36.0)
MCV: 98.1 fL (ref 80.0–100.0)
Monocytes Absolute: 0.6 10*3/uL (ref 0.1–1.0)
Monocytes Relative: 12 %
Neutro Abs: 1.9 10*3/uL (ref 1.7–7.7)
Neutrophils Relative %: 40 %
Platelets: 148 10*3/uL — ABNORMAL LOW (ref 150–400)
RBC: 3.78 MIL/uL — ABNORMAL LOW (ref 4.22–5.81)
RDW: 12.3 % (ref 11.5–15.5)
WBC: 4.7 10*3/uL (ref 4.0–10.5)
nRBC: 0 % (ref 0.0–0.2)

## 2020-09-06 LAB — TROPONIN I (HIGH SENSITIVITY)
Troponin I (High Sensitivity): 9 ng/L (ref <18)
Troponin I (High Sensitivity): 10 ng/L (ref <18)

## 2020-09-06 LAB — BRAIN NATRIURETIC PEPTIDE: B Natriuretic Peptide: 41 pg/mL (ref 0.0–100.0)

## 2020-09-06 MED ORDER — ALBUTEROL SULFATE HFA 108 (90 BASE) MCG/ACT IN AERS
2.0000 | INHALATION_SPRAY | Freq: Once | RESPIRATORY_TRACT | Status: AC
  Administered 2020-09-06: 2 via RESPIRATORY_TRACT
  Filled 2020-09-06: qty 6.7

## 2020-09-06 NOTE — ED Triage Notes (Signed)
C/o SOB (on and off) times one month.  Increasing worse the last couple days.  Denies any pain at this time.  Do have chest tightness at times.  History of heart bypass about 3 years ago.

## 2020-09-06 NOTE — ED Provider Notes (Signed)
[facility] EMERGENCY DEPARTMENT Provider Note   CSN: [id number] Arrival date & time: 09/06/20  9678     History Chief Complaint  Patient presents with  . Shortness of Breath    one month    Chad Perry is a 74 y.o. male with PMHx HTN, high cholesterol, GERD, DM, CAD s/p CABG who presents to the ED today with complaint of dyspnea on exertion intermittently for the past year. Pt also complains of intermittent chest tightness; most recent episode this morning around 7 AM which lasted approximately 20 minutes. Pt reports that his wife has been getting on to him about his shortness of breath so he spoke to a triage nurse today at the [facility] who told him to come to the ED for further evaluation. Pt does mention he saw a cardiologist earlier this year and was supposed to follow up but never heard back - he states they did some "tests" and he assumes everything was normal. Pt is a former smoker; quit in 1995 however has a 20 year pack history. Pt without any active CP. He denies hx of COPD or CHF however per chart review there is a diagnosis of CHF (unable to see any imaging or testing confirming this). Pt denies any recent weight gain. He denies orthopnea or SOB at rest. No other complaints at this time.   The history is provided by the patient and medical records.       Past Medical History:  Diagnosis Date  . Back pain   . CAD (coronary artery disease)   . Diabetes mellitus without complication (HCC)    type 2  . GERD (gastroesophageal reflux disease)   . High cholesterol   . Hypertension   . Osteoarthritis    knee  . Psoriasis   . PTSD (post-traumatic stress disorder)     There are no problems to display for this patient.   Past Surgical History:  Procedure Laterality Date  . CARDIAC SURGERY  11/2016   triple bypass  . ELBOW SURGERY    . FEMUR FRACTURE SURGERY Right 1970  . KNEE SURGERY Right 02/17/2018   replacement  . WRIST SURGERY         Family History   Problem Relation Age of Onset  . Prostate cancer Father   . Diabetes Sister     Social History   Tobacco Use  . Smoking status: Former Games developer  . Smokeless tobacco: Never Used  . Tobacco comment: quit 1995  Substance Use Topics  . Alcohol use: No  . Drug use: No    Comment: quit 1995    Home Medications Prior to Admission medications   Medication Sig Start Date End Date Taking? Authorizing Provider  aspirin EC 81 MG tablet Take 324 mg by mouth every evening.    Yes [provider]  aspirin-sod bicarb-citric acid (ALKA-SELTZER) 325 MG TBEF tablet Take 325 mg by mouth every 6 (six) hours as needed.   Yes [provider]  carvedilol (COREG) 6.25 MG tablet Take 6.25 mg by mouth 2 (two) times daily. 06/24/20  Yes [provider]  chlorthalidone (HYGROTON) 25 MG tablet Take 25 mg by mouth daily. 06/24/20  Yes [provider]  FLUoxetine (PROZAC) 20 MG capsule Take 20 mg by mouth daily.   Yes [provider]  gemfibrozil (LOPID) 600 MG tablet Take 600 mg by mouth 2 (two) times daily.   Yes [provider]  hydrALAZINE (APRESOLINE) 50 MG tablet Take 50 mg by  mouth 3 (three) times daily.   Yes [provider]  hydrochlorothiazide (HYDRODIURIL) 12.5 MG tablet Take 12.5 mg by mouth daily.   Yes [provider]  losartan (COZAAR) 100 MG tablet Take 100 mg by mouth daily.   Yes [provider]  Magnesium 250 MG TABS Take 1 tablet by mouth daily.   Yes [provider]  metFORMIN (GLUCOPHAGE) 500 MG tablet Take 500 mg by mouth daily with breakfast.    Yes [provider]  omeprazole (PRILOSEC) 20 MG capsule Take 20 mg by mouth daily.   Yes [provider]  Polyethylene Glycol 3350 POWD Take 17 g by mouth daily.   Yes [provider]  spironolactone (ALDACTONE) 25 MG tablet Take 25 mg by mouth daily. 06/24/20  Yes [provider]  sulfamethoxazole-trimethoprim (BACTRIM DS,SEPTRA  DS) 800-160 MG tablet Take 1 tablet by mouth 2 (two) times daily.   Yes [provider]  tamsulosin (FLOMAX) 0.4 MG CAPS capsule Take 0.4 mg by mouth daily.    Yes [provider]  traZODone (DESYREL) 100 MG tablet Take 100 mg by mouth at bedtime.    Yes [provider]  lisinopril (PRINIVIL,ZESTRIL) 40 MG tablet Take 40 mg by mouth daily.    [provider]    Allergies    Patient has no known allergies.  Review of Systems   Review of Systems  Constitutional: Negative for chills, diaphoresis and fever.  Respiratory: Positive for shortness of breath (on exertion). Negative for cough.   Cardiovascular: Positive for chest pain. Negative for palpitations and leg swelling.  Gastrointestinal: Negative for abdominal pain, nausea and vomiting.  All other systems reviewed and are negative.   Physical Exam Updated Vital Signs BP (!) 142/106 (BP Location: Right Arm)   Pulse 72   Temp 98.1 F (36.7 C) (Oral)   Resp 20   Ht 5\' 6"  (1.676 m)   Wt 95.3 kg   SpO2 100%   BMI 33.89 kg/m   Physical Exam Vitals and nursing note reviewed.  Constitutional:      Appearance: He is obese. He is not ill-appearing or diaphoretic.  HENT:     Head: Normocephalic and atraumatic.  Eyes:     Conjunctiva/sclera: Conjunctivae normal.  Cardiovascular:     Rate and Rhythm: Normal rate and regular rhythm.     Pulses: Normal pulses.  Pulmonary:     Effort: Pulmonary effort is normal.     Breath sounds: Normal breath sounds. No decreased breath sounds, wheezing, rhonchi or rales.  Chest:     Chest wall: No tenderness.  Abdominal:     Palpations: Abdomen is soft.     Tenderness: There is no abdominal tenderness. There is no guarding or rebound.  Musculoskeletal:     Cervical back: Neck supple.     Right lower leg: No edema.     Left lower leg: No edema.  Skin:    General: Skin is warm and dry.  Neurological:     Mental Status: He is alert.     ED Results /  Procedures / Treatments   Labs (all labs ordered are listed, but only abnormal results are displayed) Labs Reviewed  COMPREHENSIVE METABOLIC PANEL - Abnormal; Notable for the following components:      Result Value   Sodium 131 (*)    Chloride 97 (*)    Creatinine, Ser 1.53 (*)    GFR, Estimated 45 (*)    All other components within normal  limits  CBC WITH DIFFERENTIAL/PLATELET - Abnormal; Notable for the following components:   RBC 3.78 (*)    Hemoglobin 12.6 (*)    HCT 37.1 (*)    Platelets 148 (*)    All other components within normal limits  BRAIN NATRIURETIC PEPTIDE  TROPONIN I (HIGH SENSITIVITY)  TROPONIN I (HIGH SENSITIVITY)    EKG EKG Interpretation  Date/Time:  Wednesday September 06 2020 11:00:32 EDT Ventricular Rate:  66 PR Interval:  240 QRS Duration: 92 QT Interval:  396 QTC Calculation: 415 R Axis:   58 Text Interpretation: Sinus rhythm with 1st degree A-V block with Premature atrial complexes Septal infarct , age undetermined T wave abnormality, consider anterior ischemia Abnormal ECG No old tracing to compare Confirmed by Meridee Score [phone]) on 09/06/2020 1:11:03 PM   Radiology DG Chest Port 1 View  Result Date: 09/06/2020 CLINICAL DATA:  Shortness of breath EXAM: PORTABLE CHEST 1 VIEW COMPARISON:  2017 FINDINGS: Probable background changes of COPD. No consolidation or edema. No pleural effusion or pneumothorax. Normal heart size for technique. 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  No acute abnormality. Electronically Signed   By: Guadlupe Spanish M.D.   On: 09/06/2020 11:36    Procedures Procedures (including critical care time)  Medications Ordered in ED Medications  albuterol (VENTOLIN HFA) 108 (90 Base) MCG/ACT inhaler 2 puff (has no administration in time range)    ED Course  I have reviewed the triage vital signs and the nursing notes.  Pertinent labs & imaging results that were available during my care of the patient were reviewed by me and considered  in my medical decision making (see chart for details).    MDM Rules/Calculators/A&P                          74 year old male who presents to the ED today complaining of dyspnea on exertion for approximately 1 year and intermittent chest tightness, most recent episode earlier this morning.  Reports that his wife has been getting onto him about his shortness of breath and so he called a triage nurse at [facility] told him to come to the ED today.  On arrival patient is afebrile, nontachycardic and nontachypneic.  He appears to be in no acute distress.  He is satting 100% on room air.  An EKG was obtained while patient was in the waiting room with sinus rhythm and first-degree AV block.  No acute ischemic changes.  The chest x-ray was obtained which shows probable COPD, no history of same however patient is a former smoker.  No wheezing on exam to suggest this today.  Even complaint of chest tightness will work-up for ACS at this time.  Will add on a BNP as there does appear to be a diagnosis in the chart for CHF, unable to see any additional information regarding this. Have discussed case with attending physician Dr. Charm Barges who agrees with plan.    CBC without leukocytosis. Hgb stable at 12.6  Hemoglobin  Date Value Ref Range Status  09/06/2020 12.6 (L) 13.0 - 17.0 g/dL Final  56/38/9373 42.8 13.0 - 17.0 g/dL Final  76/81/1572 62.0 13.0 - 17.0 g/dL Final  '= CMP with sodium 131 and chloride 97. Will increase oral intake. Creatinine mildly elevated at 1.53 however close to pt's baseline in 2017  Lab Results  Component Value Date   CREATININE 1.53 (H) 09/06/2020   CREATININE 1.38 (H) 03/25/2016   CREATININE 1.18 12/26/2015   Troponin of  9. Will repeat. BNP normal at 41.0.   Repeat troponin of 10. Pt continues to sat at 100% on RA. Given his symptoms have been ongoing for about 1 year I do not feel he needs further workup in the ED. Pt instructed to follow up with PCP and cardiology. Have  provided albuterol inhaler given CXR findings of probably COPD however does not appear to have COPD exacerbation today. Pt stable for discharge at this time.   This note was prepared using Dragon voice recognition software and may include unintentional dictation errors due to the inherent limitations of voice recognition software.  Final Clinical Impression(s) / ED Diagnoses Final diagnoses:  Shortness of breath    Rx / DC Orders ED Discharge Orders    None       Discharge Instructions     Your labwork and imaging were all reassuring today. Given your symptoms have been ongoing for about 1 year I would recommend you follow up with your PCP and your cardiologist.   Use the albuterol inhaler as needed for shortness of breath.   Return to the ED for any worsening symptoms       Tanda RockersVenter, Wael Maestas, PA-C 09/06/20 1853    Terrilee FilesButler, Michael C, MD 09/07/20 1246

## 2020-09-06 NOTE — Discharge Instructions (Addendum)
Your labwork and imaging were all reassuring today. Given your symptoms have been ongoing for about 1 year I would recommend you follow up with your PCP and your cardiologist.   Use the albuterol inhaler as needed for shortness of breath.   Return to the ED for any worsening symptoms

## 2021-09-23 ENCOUNTER — Emergency Department (HOSPITAL_COMMUNITY): Payer: No Typology Code available for payment source

## 2021-09-23 ENCOUNTER — Other Ambulatory Visit: Payer: Self-pay

## 2021-09-23 ENCOUNTER — Observation Stay (HOSPITAL_BASED_OUTPATIENT_CLINIC_OR_DEPARTMENT_OTHER): Payer: No Typology Code available for payment source

## 2021-09-23 ENCOUNTER — Observation Stay (HOSPITAL_COMMUNITY)
Admission: EM | Admit: 2021-09-23 | Discharge: 2021-09-24 | Disposition: A | Discharge status: Home / Self Care | Payer: No Typology Code available for payment source | Attending: Internal Medicine | Admitting: Internal Medicine

## 2021-09-23 ENCOUNTER — Encounter (HOSPITAL_COMMUNITY): Payer: Self-pay | Admitting: Emergency Medicine

## 2021-09-23 DIAGNOSIS — R079 Chest pain, unspecified: Secondary | ICD-10-CM

## 2021-09-23 DIAGNOSIS — E119 Type 2 diabetes mellitus without complications: Secondary | ICD-10-CM

## 2021-09-23 DIAGNOSIS — Z23 Encounter for immunization: Secondary | ICD-10-CM | POA: Diagnosis not present

## 2021-09-23 DIAGNOSIS — I129 Hypertensive chronic kidney disease with stage 1 through stage 4 chronic kidney disease, or unspecified chronic kidney disease: Secondary | ICD-10-CM | POA: Diagnosis not present

## 2021-09-23 DIAGNOSIS — N400 Enlarged prostate without lower urinary tract symptoms: Secondary | ICD-10-CM | POA: Diagnosis not present

## 2021-09-23 DIAGNOSIS — I152 Hypertension secondary to endocrine disorders: Secondary | ICD-10-CM

## 2021-09-23 DIAGNOSIS — Z87891 Personal history of nicotine dependence: Secondary | ICD-10-CM | POA: Diagnosis not present

## 2021-09-23 DIAGNOSIS — R9431 Abnormal electrocardiogram [ECG] [EKG]: Secondary | ICD-10-CM

## 2021-09-23 DIAGNOSIS — E1159 Type 2 diabetes mellitus with other circulatory complications: Secondary | ICD-10-CM

## 2021-09-23 DIAGNOSIS — Z79899 Other long term (current) drug therapy: Secondary | ICD-10-CM | POA: Insufficient documentation

## 2021-09-23 DIAGNOSIS — N182 Chronic kidney disease, stage 2 (mild): Secondary | ICD-10-CM

## 2021-09-23 DIAGNOSIS — I10 Essential (primary) hypertension: Secondary | ICD-10-CM

## 2021-09-23 DIAGNOSIS — N1831 Chronic kidney disease, stage 3a: Secondary | ICD-10-CM

## 2021-09-23 DIAGNOSIS — N189 Chronic kidney disease, unspecified: Secondary | ICD-10-CM

## 2021-09-23 DIAGNOSIS — R0789 Other chest pain: Secondary | ICD-10-CM | POA: Diagnosis not present

## 2021-09-23 DIAGNOSIS — Z7984 Long term (current) use of oral hypoglycemic drugs: Secondary | ICD-10-CM | POA: Diagnosis not present

## 2021-09-23 DIAGNOSIS — Z7982 Long term (current) use of aspirin: Secondary | ICD-10-CM | POA: Diagnosis not present

## 2021-09-23 DIAGNOSIS — I2510 Atherosclerotic heart disease of native coronary artery without angina pectoris: Secondary | ICD-10-CM

## 2021-09-23 DIAGNOSIS — E1122 Type 2 diabetes mellitus with diabetic chronic kidney disease: Secondary | ICD-10-CM

## 2021-09-23 DIAGNOSIS — Z20822 Contact with and (suspected) exposure to covid-19: Secondary | ICD-10-CM | POA: Diagnosis not present

## 2021-09-23 DIAGNOSIS — Z951 Presence of aortocoronary bypass graft: Secondary | ICD-10-CM | POA: Diagnosis not present

## 2021-09-23 DIAGNOSIS — E782 Mixed hyperlipidemia: Secondary | ICD-10-CM

## 2021-09-23 LAB — CBC
HCT: 35.3 % — ABNORMAL LOW (ref 39.0–52.0)
Hemoglobin: 11.7 g/dL — ABNORMAL LOW (ref 13.0–17.0)
MCH: 32.7 pg (ref 26.0–34.0)
MCHC: 33.1 g/dL (ref 30.0–36.0)
MCV: 98.6 fL (ref 80.0–100.0)
Platelets: 163 10*3/uL (ref 150–400)
RBC: 3.58 MIL/uL — ABNORMAL LOW (ref 4.22–5.81)
RDW: 12.9 % (ref 11.5–15.5)
WBC: 4.4 10*3/uL (ref 4.0–10.5)
nRBC: 0 % (ref 0.0–0.2)

## 2021-09-23 LAB — BASIC METABOLIC PANEL
Anion gap: 6 (ref 5–15)
BUN: 13 mg/dL (ref 8–23)
CO2: 27 mmol/L (ref 22–32)
Calcium: 9.1 mg/dL (ref 8.9–10.3)
Chloride: 102 mmol/L (ref 98–111)
Creatinine, Ser: 1.29 mg/dL — ABNORMAL HIGH (ref 0.61–1.24)
GFR, Estimated: 59 mL/min — ABNORMAL LOW (ref >60)
Glucose, Bld: 103 mg/dL — ABNORMAL HIGH (ref 70–99)
Potassium: 3.8 mmol/L (ref 3.5–5.1)
Sodium: 135 mmol/L (ref 135–145)

## 2021-09-23 LAB — ECHOCARDIOGRAM COMPLETE
Area-P 1/2: 3.56 cm2
Calc EF: 47.6 %
Height: 66 in
S' Lateral: 3.7 cm
Single Plane A2C EF: 43.1 %
Single Plane A4C EF: 52.6 %
Weight: 3526.4 oz

## 2021-09-23 LAB — GLUCOSE, CAPILLARY
Glucose-Capillary: 99 mg/dL (ref 70–99)
Glucose-Capillary: 162 mg/dL — ABNORMAL HIGH (ref 70–99)
Glucose-Capillary: 107 mg/dL — ABNORMAL HIGH (ref 70–99)
Glucose-Capillary: 126 mg/dL — ABNORMAL HIGH (ref 70–99)

## 2021-09-23 LAB — TROPONIN I (HIGH SENSITIVITY)
Troponin I (High Sensitivity): 16 ng/L (ref <18)
Troponin I (High Sensitivity): 15 ng/L (ref <18)

## 2021-09-23 LAB — RESP PANEL BY RT-PCR (FLU A&B, COVID) ARPGX2
Influenza A by PCR: NEGATIVE
Influenza B by PCR: NEGATIVE
SARS Coronavirus 2 by RT PCR: NEGATIVE

## 2021-09-23 LAB — HEMOGLOBIN A1C
Hgb A1c MFr Bld: 6.3 % — ABNORMAL HIGH (ref 4.8–5.6)
Mean Plasma Glucose: 134.11 mg/dL

## 2021-09-23 MED ORDER — ASPIRIN EC 81 MG PO TBEC
324.0000 mg | DELAYED_RELEASE_TABLET | Freq: Every evening | ORAL | Status: DC
  Administered 2021-09-23: 324 mg via ORAL
  Filled 2021-09-23: qty 4

## 2021-09-23 MED ORDER — ENOXAPARIN SODIUM 60 MG/0.6ML IJ SOSY
50.0000 mg | PREFILLED_SYRINGE | INTRAMUSCULAR | Status: DC
  Administered 2021-09-23 – 2021-09-24 (×2): 50 mg via SUBCUTANEOUS
  Filled 2021-09-23 (×2): qty 0.6

## 2021-09-23 MED ORDER — INSULIN ASPART 100 UNIT/ML IJ SOLN
0.0000 [IU] | Freq: Three times a day (TID) | INTRAMUSCULAR | Status: DC
  Administered 2021-09-23: 2 [IU] via SUBCUTANEOUS
  Administered 2021-09-24: 1 [IU] via SUBCUTANEOUS

## 2021-09-23 MED ORDER — PANTOPRAZOLE SODIUM 40 MG PO TBEC
40.0000 mg | DELAYED_RELEASE_TABLET | Freq: Every day | ORAL | Status: DC
  Administered 2021-09-23 – 2021-09-24 (×2): 40 mg via ORAL
  Filled 2021-09-23 (×2): qty 1

## 2021-09-23 MED ORDER — NITROGLYCERIN 0.4 MG SL SUBL: 0.4000 mg | SUBLINGUAL_TABLET | SUBLINGUAL | Status: DC | PRN

## 2021-09-23 MED ORDER — GEMFIBROZIL 600 MG PO TABS
600.0000 mg | ORAL_TABLET | Freq: Two times a day (BID) | ORAL | Status: DC
  Administered 2021-09-23 – 2021-09-24 (×3): 600 mg via ORAL
  Filled 2021-09-23 (×3): qty 1

## 2021-09-23 MED ORDER — CARVEDILOL 3.125 MG PO TABS
6.2500 mg | ORAL_TABLET | Freq: Two times a day (BID) | ORAL | Status: DC
  Administered 2021-09-23 – 2021-09-24 (×3): 6.25 mg via ORAL
  Filled 2021-09-23 (×3): qty 2

## 2021-09-23 MED ORDER — PERFLUTREN LIPID MICROSPHERE
1.0000 mL | INTRAVENOUS | Status: AC | PRN
  Administered 2021-09-23: 2 mL via INTRAVENOUS
  Filled 2021-09-23: qty 10

## 2021-09-23 MED ORDER — HYDRALAZINE HCL 25 MG PO TABS
50.0000 mg | ORAL_TABLET | Freq: Three times a day (TID) | ORAL | Status: DC
  Administered 2021-09-23 – 2021-09-24 (×4): 50 mg via ORAL
  Filled 2021-09-23 (×4): qty 2

## 2021-09-23 MED ORDER — NITROGLYCERIN 0.4 MG SL SUBL
0.4000 mg | SUBLINGUAL_TABLET | Freq: Once | SUBLINGUAL | Status: AC
  Administered 2021-09-23: 0.4 mg via SUBLINGUAL
  Filled 2021-09-23: qty 1

## 2021-09-23 MED ORDER — ASPIRIN 81 MG PO CHEW
324.0000 mg | CHEWABLE_TABLET | Freq: Once | ORAL | Status: AC
  Administered 2021-09-23: 324 mg via ORAL
  Filled 2021-09-23: qty 4

## 2021-09-23 MED ORDER — TAMSULOSIN HCL 0.4 MG PO CAPS
0.4000 mg | ORAL_CAPSULE | Freq: Every day | ORAL | Status: DC
  Administered 2021-09-23 – 2021-09-24 (×2): 0.4 mg via ORAL
  Filled 2021-09-23 (×2): qty 1

## 2021-09-23 MED ORDER — INSULIN ASPART 100 UNIT/ML IJ SOLN: 0.0000 [IU] | Freq: Every day | INTRAMUSCULAR | Status: DC

## 2021-09-23 NOTE — ED Provider Notes (Signed)
[facility] EMERGENCY DEPARTMENT Provider Note   CSN: [id number] Arrival date & time: 09/23/21  7829     History Chief Complaint  Patient presents with   Chest Pain    Chad Perry is a 75 y.o. male.   Patient with history of HTN, high cholesterol, GERD, DM, CAD s/p CABG who presents with central left-sided chest pain, "pins-and-needles" that woke him from sleep about 2 AM.  Pain is constant.  No radiation of the pain to his arm, neck or back.  Some shortness of breath that is improving.  No vomiting, diarrhea, sweating, fever or cough.  He has never had this kind of pain in the past.  He does have a history of triple bypass and follows at the [place] where his cardiologist is. He also has a cardiologist in [place] he has not seen in a while.  He feels tight in his chest as well as some shortness of breath.  He has never had this kind of pain before.  Not taking thing for it at home. No abdominal pain or back pain.  No cough or fever Thinks his last stress test was about 3 years ago  The history is provided by the patient.  Chest Pain Associated symptoms: shortness of breath   Associated symptoms: no abdominal pain, no dizziness, no fatigue, no fever, no headache, no nausea, no vomiting and no weakness       Past Medical History:  Diagnosis Date   Back pain    CAD (coronary artery disease)    Diabetes mellitus without complication (HCC)    type 2   GERD (gastroesophageal reflux disease)    High cholesterol    Hypertension    Osteoarthritis    knee   Psoriasis    PTSD (post-traumatic stress disorder)     There are no problems to display for this patient.   Past Surgical History:  Procedure Laterality Date   CARDIAC SURGERY  11/2016   triple bypass   ELBOW SURGERY     FEMUR FRACTURE SURGERY Right 1970   KNEE SURGERY Right 02/17/2018   replacement   WRIST SURGERY         Family History  Problem Relation Age of Onset   Prostate cancer Father    Diabetes Sister      Social History   Tobacco Use   Smoking status: Former   Smokeless tobacco: Never   Tobacco comments:    quit 1995  Substance Use Topics   Alcohol use: No   Drug use: No    Comment: quit 1995    Home Medications Prior to Admission medications   Medication Sig Start Date End Date Taking? Authorizing Provider  aspirin EC 81 MG tablet Take 324 mg by mouth every evening.     [provider]  aspirin-sod bicarb-citric acid (ALKA-SELTZER) 325 MG TBEF tablet Take 325 mg by mouth every 6 (six) hours as needed.    [provider]  carvedilol (COREG) 6.25 MG tablet Take 6.25 mg by mouth 2 (two) times daily. 06/24/20   [provider]  chlorthalidone (HYGROTON) 25 MG tablet Take 25 mg by mouth daily. 06/24/20   [provider]  FLUoxetine (PROZAC) 20 MG capsule Take 20 mg by mouth daily.    [provider]  gemfibrozil (LOPID) 600 MG tablet Take 600 mg by mouth 2 (two) times daily.    [provider]  hydrALAZINE (APRESOLINE) 50 MG tablet Take 50 mg by mouth 3 (three) times  daily.    [provider]  hydrochlorothiazide (HYDRODIURIL) 12.5 MG tablet Take 12.5 mg by mouth daily.    [provider]  lisinopril (PRINIVIL,ZESTRIL) 40 MG tablet Take 40 mg by mouth daily.    [provider]  losartan (COZAAR) 100 MG tablet Take 100 mg by mouth daily.    [provider]  Magnesium 250 MG TABS Take 1 tablet by mouth daily.    [provider]  metFORMIN (GLUCOPHAGE) 500 MG tablet Take 500 mg by mouth daily with breakfast.     [provider]  omeprazole (PRILOSEC) 20 MG capsule Take 20 mg by mouth daily.    [provider]  Polyethylene Glycol 3350 POWD Take 17 g by mouth daily.    [provider]  spironolactone (ALDACTONE) 25 MG tablet Take 25 mg by mouth daily. 06/24/20   [provider]  sulfamethoxazole-trimethoprim (BACTRIM DS,SEPTRA DS) 800-160 MG tablet Take 1 tablet  by mouth 2 (two) times daily.    [provider]  tamsulosin (FLOMAX) 0.4 MG CAPS capsule Take 0.4 mg by mouth daily.     [provider]  traZODone (DESYREL) 100 MG tablet Take 100 mg by mouth at bedtime.     [provider]    Allergies    Patient has no known allergies.  Review of Systems   Review of Systems  Constitutional:  Negative for activity change, appetite change, fatigue and fever.  HENT:  Negative for congestion and rhinorrhea.   Respiratory:  Positive for chest tightness and shortness of breath.   Cardiovascular:  Positive for chest pain.  Gastrointestinal:  Negative for abdominal pain, nausea and vomiting.  Genitourinary:  Negative for dysuria and hematuria.  Musculoskeletal:  Negative for arthralgias and myalgias.  Skin:  Negative for rash.  Neurological:  Negative for dizziness, weakness and headaches.   all other systems are negative except as noted in the HPI and PMH.   Physical Exam Updated Vital Signs BP (!) 187/87 (BP Location: Left Arm)   Pulse 79   Temp 99.1 F (37.3 C) (Oral)   Resp (!) 21   Ht 5\' 6"  (1.676 m)   Wt 101.2 kg   SpO2 98%   BMI 35.99 kg/m   Physical Exam Vitals and nursing note reviewed.  Constitutional:      General: He is not in acute distress.    Appearance: Normal appearance. He is well-developed and normal weight. He is not ill-appearing.  HENT:     Head: Normocephalic and atraumatic.     Mouth/Throat:     Pharynx: No oropharyngeal exudate.  Eyes:     Conjunctiva/sclera: Conjunctivae normal.     Pupils: Pupils are equal, round, and reactive to light.  Neck:     Comments: No meningismus. Cardiovascular:     Rate and Rhythm: Normal rate and regular rhythm.     Heart sounds: Normal heart sounds. No murmur heard. Pulmonary:     Effort: Pulmonary effort is normal. No respiratory distress.     Breath sounds: Normal breath sounds.     Comments: Chest wall nontender Chest:     Chest wall: No  tenderness.  Abdominal:     Palpations: Abdomen is soft.     Tenderness: There is no abdominal tenderness. There is no guarding or rebound.  Musculoskeletal:        General: No tenderness. Normal range of motion.     Cervical back: Normal range of motion and neck supple.  Skin:  General: Skin is warm.  Neurological:     Mental Status: He is alert and oriented to person, place, and time.     Cranial Nerves: No cranial nerve deficit.     Motor: No abnormal muscle tone.     Coordination: Coordination normal.     Comments:  5/5 strength throughout. CN 2-12 intact.Equal grip strength.   Psychiatric:        Behavior: Behavior normal.    ED Results / Procedures / Treatments   Labs (all labs ordered are listed, but only abnormal results are displayed) Labs Reviewed  BASIC METABOLIC PANEL - Abnormal; Notable for the following components:      Result Value   Glucose, Bld 103 (*)    Creatinine, Ser 1.29 (*)    GFR, Estimated 59 (*)    All other components within normal limits  CBC - Abnormal; Notable for the following components:   RBC 3.58 (*)    Hemoglobin 11.7 (*)    HCT 35.3 (*)    All other components within normal limits  TROPONIN I (HIGH SENSITIVITY)    EKG EKG Interpretation  Date/Time:  Sunday September 23 2021 03:26:32 EST Ventricular Rate:  72 PR Interval:  220 QRS Duration: 92 QT Interval:  359 QTC Calculation: 393 R Axis:   57 Text Interpretation: Sinus rhythm Atrial premature complexes Prolonged PR interval Borderline repolarization abnormality lateral ST depression Confirmed by Ezequiel Essex [phone]) on 09/23/2021 3:31:57 AM  Radiology DG Chest Portable 1 View  Result Date: 09/23/2021 CLINICAL DATA:  Chest pain EXAM: PORTABLE CHEST 1 VIEW COMPARISON:  09/06/2020 FINDINGS: The heart size and mediastinal contours are within normal limits. Both lungs are clear. The visualized skeletal structures are unremarkable. Remote median sternotomy. IMPRESSION: No active  disease. Electronically Signed   By: Ulyses Jarred M.D.   On: 09/23/2021 03:58    Procedures Procedures   Medications Ordered in ED Medications  aspirin chewable tablet 324 mg (has no administration in time range)  nitroGLYCERIN (NITROSTAT) SL tablet 0.4 mg (has no administration in time range)    ED Course  I have reviewed the triage vital signs and the nursing notes.  Pertinent labs & imaging results that were available during my care of the patient were reviewed by me and considered in my medical decision making (see chart for details).    MDM Rules/Calculators/A&P                          Central chest pain that woke him from sleep associated with shortness of breath.  EKG shows no acute ST elevation but does show some depression laterally which is not present on repeat.  Aspirin and nitroglycerin given  Heart score is 6.  Patient states he has a cardiologist in [place] who has not seen him more than 2 years.  Chest pain has resolved after 1 nitroglycerin.  Troponin negative x1. Low suspicion for pulmonary embolism or aortic dissection.  Given elevated risk factors, patient agreeable to rule out admission. D/w Dr. Josephine Cables.  Final Clinical Impression(s) / ED Diagnoses Final diagnoses:  None    Rx / DC Orders ED Discharge Orders     None        Corney Knighton, Annie Main, MD 09/23/21 0522

## 2021-09-23 NOTE — ED Triage Notes (Signed)
Pt with c/o chest pain that woke him from his sleep around 2am. States he feels like he is "breathing hard" as well.

## 2021-09-23 NOTE — Progress Notes (Signed)
Chad Perry is a 75 y.o. male with medical history significant for essential hypertension, mixed hyperlipidemia, type 2 diabetes mellitus, GERD, CAD s/p CABG who presents to the emergency department due to left-sided chest pain which woke him from sleep around 2 AM.  Pain was described as " pins-and-needles" sensation, it was constant, reproducible and nonradiating, but was associated with some shortness of breath.  Patient follows with his cardiologist at [facility] due to history of triple bypass.    High-sensitivity troponin negative x2.  No acute ischemia seen on 12 EKG.  2D echo done on 09/23/2021 showed LVEF 50 to 55%, no regional wall motion abnormalities, left atrial size was moderately dilated.  09/23/2021: Patient was seen and examined at his bedside.  He denies any chest pain at the time of this visit.  We will monitor overnight on telemetry for any acute changes.  Cardiology will be consulted in the morning.  Please refer to H&P dictated by my partner on 09/23/21 for further details of the assessment and plan.

## 2021-09-23 NOTE — H&P (Addendum)
History and Physical  Chad Perry WNU:[id number] DOB: 08-08-1946 DOA: 09/23/2021  Referring physician: Glynn Octave, MD PCP: [facility]  Patient coming from: Home  Chief Complaint: Chest pain  HPI: Chad Perry is a 75 y.o. male with medical history significant for essential hypertension, mixed hyperlipidemia, type 2 diabetes mellitus, GERD, CAD s/p CABG who presents to the emergency department due to left-sided chest pain which woke him from sleep around 2 AM.  Pain was described as " pins-and-needles" sensation, it was constant, reproducible and nonradiating, but was associated with some shortness of breath.  Patient follows with his cardiologist at [facility] due to history of triple bypass.  He denies fever, chills, cough, nausea, vomiting or abdominal pain.  ED Course:  In the emergency department, he was tachypneic, BP was 140/86, but other vital signs were within normal range.  Work-up in the ED showed normocytic anemia, creatinine was 1.29 (this was 1.53 about 1 year ago).  Troponin x1 was 16. Chest x-ray showed no active disease Aspirin 324 mg x 1 was given.  Nitroglycerin sublingual x1 was given and chest pain resolved.  Hospitalist was asked to admit patient for further evaluation and management.  Review of Systems: Constitutional: Negative for chills and fever.  HENT: Negative for ear pain and sore throat.   Eyes: Negative for pain and visual disturbance.  Respiratory: Positive for shortness of breath and chest tightness.  Negative for cough Cardiovascular: Positive for chest pain and negative for palpitations.  Gastrointestinal: Negative for abdominal pain and vomiting.  Endocrine: Negative for polyphagia and polyuria.  Genitourinary: Negative for decreased urine volume, dysuria, enuresis Musculoskeletal: Negative for arthralgias and back pain.  Skin: Negative for color change and rash.  Allergic/Immunologic: Negative for immunocompromised state.  Neurological:  Negative for tremors, syncope, speech difficulty, weakness, light-headedness and headaches.  Hematological: Does not bruise/bleed easily.  All other systems reviewed and are negative  Past Medical History:  Diagnosis Date   Back pain    CAD (coronary artery disease)    Diabetes mellitus without complication (HCC)    type 2   GERD (gastroesophageal reflux disease)    High cholesterol    Hypertension    Osteoarthritis    knee   Psoriasis    PTSD (post-traumatic stress disorder)    Past Surgical History:  Procedure Laterality Date   CARDIAC SURGERY  11/2016   triple bypass   ELBOW SURGERY     FEMUR FRACTURE SURGERY Right 1970   KNEE SURGERY Right 02/17/2018   replacement   WRIST SURGERY      Social History:  reports that he has quit smoking. He has never used smokeless tobacco. He reports that he does not drink alcohol and does not use drugs.   No Known Allergies  Family History  Problem Relation Age of Onset   Prostate cancer Father    Diabetes Sister      Prior to Admission medications   Medication Sig Start Date End Date Taking? Authorizing Provider  aspirin EC 81 MG tablet Take 324 mg by mouth every evening.     [provider]  aspirin-sod bicarb-citric acid (ALKA-SELTZER) 325 MG TBEF tablet Take 325 mg by mouth every 6 (six) hours as needed.    [provider]  carvedilol (COREG) 6.25 MG tablet Take 6.25 mg by mouth 2 (two) times daily. 06/24/20   [provider]  chlorthalidone (HYGROTON) 25 MG tablet Take 25 mg by mouth daily. 06/24/20   [provider]  FLUoxetine (PROZAC) 20 MG capsule Take 20 mg by mouth daily.    [provider]  gemfibrozil (LOPID) 600 MG tablet Take 600 mg by mouth 2 (two) times daily.    [provider]  hydrALAZINE (APRESOLINE) 50 MG tablet Take 50 mg by mouth 3 (three) times daily.    [provider]  hydrochlorothiazide (HYDRODIURIL) 12.5 MG tablet Take 12.5 mg by mouth daily.     [provider]  lisinopril (PRINIVIL,ZESTRIL) 40 MG tablet Take 40 mg by mouth daily.    [provider]  losartan (COZAAR) 100 MG tablet Take 100 mg by mouth daily.    [provider]  Magnesium 250 MG TABS Take 1 tablet by mouth daily.    [provider]  metFORMIN (GLUCOPHAGE) 500 MG tablet Take 500 mg by mouth daily with breakfast.     [provider]  omeprazole (PRILOSEC) 20 MG capsule Take 20 mg by mouth daily.    [provider]  Polyethylene Glycol 3350 POWD Take 17 g by mouth daily.    [provider]  spironolactone (ALDACTONE) 25 MG tablet Take 25 mg by mouth daily. 06/24/20   [provider]  sulfamethoxazole-trimethoprim (BACTRIM DS,SEPTRA DS) 800-160 MG tablet Take 1 tablet by mouth 2 (two) times daily.    [provider]  tamsulosin (FLOMAX) 0.4 MG CAPS capsule Take 0.4 mg by mouth daily.     [provider]  traZODone (DESYREL) 100 MG tablet Take 100 mg by mouth at bedtime.     [provider]    Physical Exam: BP (!) 161/82   Pulse (!) 59   Temp 99.1 F (37.3 C) (Oral)   Resp (!) 22   Ht 5\' 6"  (1.676 m)   Wt 101.2 kg   SpO2 97%   BMI 35.99 kg/m   General: 75 y.o. year-old male well developed well nourished in no acute distress.  Alert and oriented x3. HEENT: NCAT, EOMI Neck: Supple, trachea medial Cardiovascular: Regular rate and rhythm with no rubs or gallops.  No thyromegaly or JVD noted.  No lower extremity edema. 2/4 pulses in all 4 extremities. Respiratory: Clear to auscultation with no wheezes or rales. Good inspiratory effort. Abdomen: Soft, nontender nondistended with normal bowel sounds x4 quadrants. Muskuloskeletal: No cyanosis, clubbing or edema noted bilaterally Neuro: CN II-XII intact, strength 5/5 x 4, sensation, reflexes intact Skin: No ulcerative lesions noted or rashes Psychiatry: Judgement and insight appear normal. Mood is appropriate for condition  and setting          Labs on Admission:  Basic Metabolic Panel: Recent Labs  Lab 09/23/21 0337  NA 135  K 3.8  CL 102  CO2 27  GLUCOSE 103*  BUN 13  CREATININE 1.29*  CALCIUM 9.1   Liver Function Tests: No results for input(s): AST, ALT, ALKPHOS, BILITOT, PROT, ALBUMIN in the last 168 hours. No results for input(s): LIPASE, AMYLASE in the last 168 hours. No results for input(s): AMMONIA in the last 168 hours. CBC: Recent Labs  Lab 09/23/21 0337  WBC 4.4  HGB 11.7*  HCT 35.3*  MCV 98.6  PLT 163   Cardiac Enzymes: No results for input(s): CKTOTAL, CKMB, CKMBINDEX, TROPONINI in the last 168 hours.  BNP (last 3 results) No results for input(s): BNP in the last 8760 hours.  ProBNP (last 3 results) No results for input(s): PROBNP in the last 8760 hours.  CBG: No results for input(s): GLUCAP in the last 168 hours.  Radiological Exams on Admission: DG Chest Portable 1 View  Result Date: 09/23/2021 CLINICAL DATA:  Chest pain EXAM: PORTABLE CHEST 1 VIEW COMPARISON:  09/06/2020 FINDINGS: The heart size and mediastinal contours are within normal limits. Both lungs are clear. The visualized skeletal structures are unremarkable. Remote median sternotomy. IMPRESSION: No active disease. Electronically Signed   By: Ulyses Jarred M.D.   On: 09/23/2021 03:58    EKG: I independently viewed the EKG done and my findings are as followed: Normal sinus rhythm at a rate of 73 bpm with APCs and borderline prolonged QTc at 499 ms  Assessment/Plan Present on Admission:  Chest pain  Principal Problem:   Chest pain Active Problems:   Essential hypertension   Mixed hyperlipidemia   Type 2 diabetes mellitus (HCC)   CAD (coronary artery disease)   Atypical chest pain out ACS Patient presents with left-sided chest pain which was reproducible prior to medication and was relieved with sublingual nitroglycerin. Troponin x 2 -16 > 15. Heart score = 5 Cardiovascular risk factors include  hyperlipidemia, hyperlipidemia, type 2 diabetes mellitus, CAD Continue telemetry  EKG personally reviewed showed normal sinus rhythm at a rate of 73 bpm with APCs and borderline prolonged QTc at 499 ms Patient is currently chest pain-free, echocardiogram will be done in the morning Continue aspirin, nitroglycerin, and betablocker.   Borderline prolonged QTc (499 ms) Avoid QT prolonging drugs Magnesium level will be checked  CKD stage 3A Stable, creatinine within baseline range Renally adjust medications, avoid nephrotoxic agents/dehydration/hypotension  Essential hypertension Continue Coreg and hydralazine Continue other home medication(s) when med rec is updated  Mixed hyperlipidemia Continue gemfibrozil  Type 2 diabetes mellitus Continue ISS and hypoglycemic protocol Metformin will be held at this time  CAD s/p CABG Continue aspirin, Coreg, Lopid  BPH Continue Flomax  DVT prophylaxis: Lovenox, SCDs  Code Status: Full code  Family Communication: None at bedside  Disposition Plan:  Patient is from:                        home Anticipated DC to:                   SNF or family members home Anticipated DC date:               1-2 days Anticipated DC barriers:         Patient requires inpatient management due to chest pain requiring further work-up   Consults called: None  Admission status: Observation    Bernadette Hoit MD [facility]  09/23/2021, 5:27 AM

## 2021-09-23 NOTE — Clinical Social Work Note (Signed)
[facility] notification done [phone]

## 2021-09-24 DIAGNOSIS — R0789 Other chest pain: Secondary | ICD-10-CM | POA: Diagnosis not present

## 2021-09-24 LAB — COMPREHENSIVE METABOLIC PANEL
ALT: 21 U/L (ref 0–44)
AST: 27 U/L (ref 15–41)
Albumin: 3.4 g/dL — ABNORMAL LOW (ref 3.5–5.0)
Alkaline Phosphatase: 58 U/L (ref 38–126)
Anion gap: 7 (ref 5–15)
BUN: 18 mg/dL (ref 8–23)
CO2: 25 mmol/L (ref 22–32)
Calcium: 9.4 mg/dL (ref 8.9–10.3)
Chloride: 103 mmol/L (ref 98–111)
Creatinine, Ser: 1.31 mg/dL — ABNORMAL HIGH (ref 0.61–1.24)
GFR, Estimated: 57 mL/min — ABNORMAL LOW (ref >60)
Glucose, Bld: 131 mg/dL — ABNORMAL HIGH (ref 70–99)
Potassium: 4.1 mmol/L (ref 3.5–5.1)
Sodium: 135 mmol/L (ref 135–145)
Total Bilirubin: 0.7 mg/dL (ref 0.3–1.2)
Total Protein: 6.9 g/dL (ref 6.5–8.1)

## 2021-09-24 LAB — PROTIME-INR
INR: 1 (ref 0.8–1.2)
Prothrombin Time: 12.8 seconds (ref 11.4–15.2)

## 2021-09-24 LAB — APTT: aPTT: 30 seconds (ref 24–36)

## 2021-09-24 LAB — PHOSPHORUS: Phosphorus: 3.8 mg/dL (ref 2.5–4.6)

## 2021-09-24 LAB — CBC
HCT: 36.3 % — ABNORMAL LOW (ref 39.0–52.0)
Hemoglobin: 11.9 g/dL — ABNORMAL LOW (ref 13.0–17.0)
MCH: 32.7 pg (ref 26.0–34.0)
MCHC: 32.8 g/dL (ref 30.0–36.0)
MCV: 99.7 fL (ref 80.0–100.0)
Platelets: 153 10*3/uL (ref 150–400)
RBC: 3.64 MIL/uL — ABNORMAL LOW (ref 4.22–5.81)
RDW: 12.7 % (ref 11.5–15.5)
WBC: 3.3 10*3/uL — ABNORMAL LOW (ref 4.0–10.5)
nRBC: 0 % (ref 0.0–0.2)

## 2021-09-24 LAB — GLUCOSE, CAPILLARY
Glucose-Capillary: 120 mg/dL — ABNORMAL HIGH (ref 70–99)
Glucose-Capillary: 133 mg/dL — ABNORMAL HIGH (ref 70–99)

## 2021-09-24 LAB — MAGNESIUM: Magnesium: 1.6 mg/dL — ABNORMAL LOW (ref 1.7–2.4)

## 2021-09-24 MED ORDER — INFLUENZA VAC A&B SA ADJ QUAD 0.5 ML IM PRSY
0.5000 mL | PREFILLED_SYRINGE | INTRAMUSCULAR | Status: AC
  Administered 2021-09-24: 0.5 mL via INTRAMUSCULAR
  Filled 2021-09-24: qty 0.5

## 2021-09-24 MED ORDER — MAGNESIUM SULFATE 2 GM/50ML IV SOLN
2.0000 g | INTRAVENOUS | Status: AC
  Administered 2021-09-24: 2 g via INTRAVENOUS
  Filled 2021-09-24: qty 50

## 2021-09-24 MED ORDER — HYDRALAZINE HCL 25 MG PO TABS
100.0000 mg | ORAL_TABLET | Freq: Three times a day (TID) | ORAL | Status: DC
  Filled 2021-09-24: qty 4

## 2021-09-24 MED ORDER — CARVEDILOL 6.25 MG PO TABS: 6.2500 mg | ORAL_TABLET | Freq: Two times a day (BID) | ORAL | 0 refills | Status: AC

## 2021-09-24 MED ORDER — HYDRALAZINE HCL 100 MG PO TABS
100.0000 mg | ORAL_TABLET | Freq: Three times a day (TID) | ORAL | 0 refills | Status: AC
Start: 2021-09-24 — End: 2024-08-23

## 2021-09-24 NOTE — Consult Note (Signed)
Cardiology Consult Note:   Patient ID: Chad Perry MRN: [id number]; DOB: 10-Sep-1946   Admission date: 09/23/2021  PCP:  [facility]   [facility] Providers Cardiologist:  New to [place]; [facility]  Chief Complaint:  CP Eval  Patient Profile:   Chad Perry is a 75 y.o. male with CAD s/p 2019 CABG with unclear anatomy, HTN with DM, GERD, orthostatic hypotension who is being seen 09/24/2021 for the evaluation of non cardiac chest pain and shortness of breath in the setting of labile BP.  History of Present Illness:   Mr. [name] notes that he is feeling different than when he had a CABG.  Notes that when he was been seen in [place] and the [place], that he had chest pressure and DOE that lead him to [facility].  He was multivessel disease NOS- had CABG at [facility].  Notes that with this eval he has had a different type of tingling chest pain without chest pressure; because it woke him from sleep and because he had SOB he sought care.   Notes that he has had a change to his medications:  he had new antihypertensives for which he was on several medications, he had dizziness and his PCP took him off his antihypertensives with resolution of this.  He does not check his BP at home.  In the ED patient received medications inlcuding ASA and nitro with resolution of the pain.Marland Kitchen  Key labs notable for normal hs-troponin X2.  Key imaging includes low normal LVEF with mild LVH and no WMA from 09/23/21.  Patient is pending discharge. Cardiology called for evaluation in the timing of follow up ischemic work up..    Past Medical History:  Diagnosis Date   Back pain    CAD (coronary artery disease)    Diabetes mellitus without complication (HCC)    type 2   GERD (gastroesophageal reflux disease)    High cholesterol    Hypertension    Osteoarthritis    knee   Psoriasis    PTSD (post-traumatic stress disorder)     Past Surgical History:  Procedure Laterality Date   CARDIAC  SURGERY  11/2016   triple bypass   ELBOW SURGERY     FEMUR FRACTURE SURGERY Right 1970   KNEE SURGERY Right 02/17/2018   replacement   WRIST SURGERY       Medications Prior to Admission: Prior to Admission medications   Medication Sig Start Date End Date Taking? Authorizing Provider  albuterol (VENTOLIN HFA) 108 (90 Base) MCG/ACT inhaler Inhale 2 puffs into the lungs every 4 (four) hours as needed for shortness of breath. 04/07/21  Yes [provider]  aspirin EC 81 MG tablet Take 81 mg by mouth every evening.   Yes [provider]  atorvastatin (LIPITOR) 80 MG tablet Take 1 tablet by mouth at bedtime. 09/14/21  Yes [provider]  carvedilol (COREG) 12.5 MG tablet Take 0.5 tablets by mouth 2 (two) times daily. 07/24/21  Yes [provider]  chlorthalidone (HYGROTON) 25 MG tablet Take 25 mg by mouth daily. 06/24/20  Yes [provider]  FLUoxetine (PROZAC) 40 MG capsule Take 1 capsule by mouth daily. 03/12/21  Yes [provider]  gemfibrozil (LOPID) 600 MG tablet Take 600 mg by mouth 2 (two) times daily.   Yes [provider]  hydrALAZINE (APRESOLINE) 100 MG tablet Take 50 mg by mouth every 8 (eight) hours. 10/25/20  Yes [provider]  hydrochlorothiazide (HYDRODIURIL) 12.5 MG  tablet Take 12.5 mg by mouth daily.   Yes [provider]  lisinopril (PRINIVIL,ZESTRIL) 40 MG tablet Take 40 mg by mouth daily.   Yes [provider]  losartan (COZAAR) 100 MG tablet Take 100 mg by mouth daily.   Yes [provider]  Magnesium 250 MG TABS Take 1 tablet by mouth daily.   Yes [provider]  metFORMIN (GLUCOPHAGE) 500 MG tablet Take 500 mg by mouth daily with breakfast.    Yes [provider]  omeprazole (PRILOSEC) 20 MG capsule Take 20 mg by mouth daily.   Yes [provider]  Polyethylene Glycol 3350 POWD Take 17 g by mouth daily.   Yes [provider]  traZODone  (DESYREL) 100 MG tablet Take 100 mg by mouth at bedtime.    Yes [provider]  aspirin-sod bicarb-citric acid (ALKA-SELTZER) 325 MG TBEF tablet Take 325 mg by mouth every 6 (six) hours as needed. Patient not taking: Reported on 09/23/2021    [provider]  carvedilol (COREG) 6.25 MG tablet Take 6.25 mg by mouth daily. Patient not taking: Reported on 09/23/2021 06/24/20   [provider]  FLUoxetine (PROZAC) 20 MG capsule Take 20 mg by mouth daily. Patient not taking: Reported on 09/23/2021    [provider]  hydrALAZINE (APRESOLINE) 50 MG tablet Take 50 mg by mouth 3 (three) times daily. Patient not taking: Reported on 09/23/2021    [provider]  spironolactone (ALDACTONE) 25 MG tablet Take 25 mg by mouth daily. Patient not taking: Reported on 09/23/2021 06/24/20   [provider]  sulfamethoxazole-trimethoprim (BACTRIM DS,SEPTRA DS) 800-160 MG tablet Take 1 tablet by mouth 2 (two) times daily. Patient not taking: Reported on 09/23/2021    [provider]  tamsulosin (FLOMAX) 0.4 MG CAPS capsule Take 0.4 mg by mouth daily.  Patient not taking: Reported on 09/23/2021    [provider]     Allergies:   No Known Allergies  Social History:   Social History   Socioeconomic History   Marital status: Married    Spouse name: [name]   Number of children: 3   Years of education: 12   Highest education level: Not on file  Occupational History    Comment: retired  Tobacco Use   Smoking status: Former   Smokeless tobacco: Never   Tobacco comments:    quit 1995  Substance and Sexual Activity   Alcohol use: No   Drug use: No    Comment: quit 1995   Sexual activity: Not on file  Other Topics Concern   Not on file  Social History Narrative   lives with wife   Caffeine= none   Social Determinants of Health   Financial Resource Strain: Not on file  Food Insecurity: Not on file  Transportation Needs: Not on file   Physical Activity: Not on file  Stress: Not on file  Social Connections: Not on file  Intimate Partner Violence: Not on file    Family History:   The patient's family history includes Diabetes in his sister; Prostate cancer in his father.   No history of CAD in the family.  ROS:  Please see the history of present illness.  All other ROS reviewed and negative.     Physical Exam/Data:   Vitals:   09/23/21 1439 09/23/21 2059 09/24/21 0540 09/24/21 0900  BP: 130/79 (!) 152/88 (!) 157/113 (!) 176/92  Pulse: 68 70 65 63  Resp: 16 18 17  Temp: 97.9 F (36.6 C) 98.5 F (36.9 C) 98.2 F (36.8 C)   TempSrc:  Oral Oral   SpO2: 98% 98% 99%   Weight:      Height:        Intake/Output Summary (Last 24 hours) at 09/24/2021 1206 Last data filed at 09/24/2021 0541 Gross per 24 hour  Intake 480 ml  Output 1900 ml  Net -1420 ml   Last 3 Weights 09/23/2021 09/23/2021 09/06/2020  Weight (lbs) 220 lb 6.4 oz 223 lb 210 lb  Weight (kg) 99.973 kg 101.152 kg 95.255 kg     Body mass index is 35.57 kg/m.   Gen: No distress, morbidly obese Neck: No JVD Ears: R ear Frank Sign Mouth: Poor Dentition Cardiac: No Rubs or Gallops, no Murmur, normal rate +2 radial pulses, distant heart sounds Respiratory: Clear to auscultation bilaterally, normal effort, normal  respiratory rate GI: Soft, nontender, non-distended  MS: No  edema;  moves all extremities Integument: Skin feels warm Neuro:  At time of evaluation, alert and oriented to person/place/time/situation  Psych: Normal affect, patient feels in good spirits   EKG:  The ECG that was done  was personally reviewed and demonstrates SR 1st HB with rate 72 and rare PACs Telemetry: SR with PACs  Laboratory Data:  High Sensitivity Troponin:   Recent Labs  Lab 09/23/21 0337 09/23/21 0527  TROPONINIHS 16 15      Chemistry Recent Labs  Lab 09/23/21 0337 09/24/21 0428  NA 135 135  K 3.8 4.1  CL 102 103  CO2 27 25  GLUCOSE 103* 131*   BUN 13 18  CREATININE 1.29* 1.31*  CALCIUM 9.1 9.4  MG  --  1.6*  GFRNONAA 59* 57*  ANIONGAP 6 7    Recent Labs  Lab 09/24/21 0428  PROT 6.9  ALBUMIN 3.4*  AST 27  ALT 21  ALKPHOS 58  BILITOT 0.7   Lipids No results for input(s): CHOL, TRIG, HDL, LABVLDL, LDLCALC, CHOLHDL in the last 168 hours. Hematology Recent Labs  Lab 09/23/21 0337 09/24/21 0428  WBC 4.4 3.3*  RBC 3.58* 3.64*  HGB 11.7* 11.9*  HCT 35.3* 36.3*  MCV 98.6 99.7  MCH 32.7 32.7  MCHC 33.1 32.8  RDW 12.9 12.7  PLT 163 153   Thyroid No results for input(s): TSH, FREET4 in the last 168 hours. BNPNo results for input(s): BNP, PROBNP in the last 168 hours.  DDimer No results for input(s): DDIMER in the last 168 hours.   Radiology/Studies:  No results found.   Assessment and Plan:   Coronary Artery Disease; Obstructive HTN with DM; Morbid Obesity Orthostatic Hypotension - asymptomatic - anatomy unclear; if he will establish with [place] we will need to get old records - continue ASA 81 mg;  - continue atorvastatin 80, gembfibrozil is reasonable, goal LDL < 70 potentially under 55 - continue Coreg - We have returned patient's hydralazine; we have discussed ambulatory BP monitoring and medication adjustments - patient in unclear if he takes ACEi, ARB or both; will need assistance with medicine reconciliation; should not DC on ACEi and ARB   [facility] will sign off.   Medication Recommendations:  Hydralazine, Coreg, and CTZ, will need careful medicine reconciliation as he has HTN urgency, multiple diuretics and ARBs/ACEi; and doesn't know what he is actually taking Other recommendations (labs, testing, etc):  BMP in one week Follow up as an outpatient:  We will arrange one time follow up with our group; patient plans to return  to [facility]     For questions or updates, please contact [facility] Please consult [email or web address] for contact info under     Signed, Christell Constant, MD   09/24/2021 12:06 PM

## 2021-09-24 NOTE — Discharge Summary (Addendum)
Discharge Summary  Chad Perry VOH:[id number] DOB: 1946/10/27  PCP: [facility]  Admit date: 09/23/2021 Discharge date: 09/24/2021  Time spent: 35 minutes.  Recommendations for Outpatient Follow-up:  Follow-up with cardiology in 1 to 2 weeks Follow-up with your PCP within a week  Discharge Diagnoses:  Active Hospital Problems   Diagnosis Date Noted   Atypical chest pain 09/23/2021   Essential hypertension 09/23/2021   Mixed hyperlipidemia 09/23/2021   Type 2 diabetes mellitus (HCC) 09/23/2021   CAD (coronary artery disease) 09/23/2021   Prolonged QT interval 09/23/2021   BPH (benign prostatic hyperplasia) 09/23/2021   Chronic kidney disease 09/23/2021    Resolved Hospital Problems  No resolved problems to display.    Discharge Condition: Stable  Diet recommendation: Heart healthy carb modified diet.  Vitals:   09/24/21 0540 09/24/21 0900  BP: (!) 157/113 (!) 176/92  Pulse: 65 63  Resp: 17   Temp: 98.2 F (36.8 C)   SpO2: 99%     History of present illness:  Chad Perry is a 75 y.o. male with medical history significant for essential hypertension, mixed hyperlipidemia, type 2 diabetes mellitus, GERD, CAD s/p CABG, OSA, obesity, who presents to [facility] ED with complaints of left-sided chest pain which woke him up from sleep around 2 AM.  Pain was described as " pins-and-needles" sensation, it was constant, reproducible and nonradiating, but was associated with some shortness of breath with minimal exertion.  Patient follows with cardiology at [facility].     High-sensitivity troponin were negative x2.  No acute ischemia seen on 12 lead EKG.  2D echo done on 09/23/2021 showed LVEF 50 to 55%, no regional wall motion abnormalities, left atrial size was moderately dilated.    Seen by cardiology, home hydralazine was restarted by cardiology due to uncontrolled hypertension.    Cardiology recommended aspirin 81 mg daily, Coreg 6.25 mg twice daily, Lopid 600 mg twice  daily, p.o. hydralazine 100 mg 3 times daily.  Patient will follow up with cardiology Dr. Izora Ribas outpatient, BMP will be repeated in 1 week on 10/01/2021 at the recommendation of cardiology.  Okay to discharge home from a cardiology standpoint.   09/24/2021: Patient was seen at his bedside.  He denies having any chest pain, palpitations or dyspnea.  He has no new complaints.  There were no acute events overnight.  Hospital Course:  Principal Problem:   Atypical chest pain Active Problems:   Essential hypertension   Mixed hyperlipidemia   Type 2 diabetes mellitus (HCC)   CAD (coronary artery disease)   Prolonged QT interval   BPH (benign prostatic hyperplasia)   Chronic kidney disease  Chest pain, ACS ruled out. Troponin negative x2, twelve-lead EKG nonacute. No acute findings on 2D echo, findings as stated above. Seen by cardiology.  Obstructive coronary artery disease/hyperlipidemia Follow recommendations by cardiology as stated above. Continue aspirin 81 mg daily, Lipitor 80 mg daily, gemfibrozil, goal LDL less than 70. Follow-up with cardiology  Hypertension, BP is not controlled Hydralazine restarted by cardiology Per cardiology continue Coreg 6.25 mg twice daily, hydralazine 100 mg 3 times daily. Patient is unclear if he takes ACE inhibitor, ARB for both, hold off for now. Follow-up with cardiology and your PCP.  Type 2 diabetes, controlled Latest hemoglobin A1c 6.3 on 09/23/2021 Resume home regimen  Hypomagnesemia Magnesium 1.6 Repleted intravenously with 2 g of magnesium sulfate x1  OSA Recommend continue CPAP at night  Obesity BMI 35 Recommend weight loss outpatient with regular physical activity and  healthy dieting.    Procedures: 2D echo on 09/23/2021.  Consultations: Cardiology  Discharge Exam: BP (!) 176/92 (BP Location: Left Arm)   Pulse 63   Temp 98.2 F (36.8 C) (Oral)   Resp 17   Ht  (1.676 m)   Wt 100 kg   SpO2 99%   BMI 35.57  kg/m  General: 75 y.o. year-old male well developed well nourished in no acute distress.  Alert and oriented x3. Cardiovascular: Regular rate and rhythm with no rubs or gallops.  No thyromegaly or JVD noted.   Respiratory: Clear to auscultation with no wheezes or rales. Good inspiratory effort. Abdomen: Soft nontender nondistended with normal bowel sounds x4 quadrants. Musculoskeletal: No lower extremity edema. 2/4 pulses in all 4 extremities. Skin: No ulcerative lesions noted or rashes, Psychiatry: Mood is appropriate for condition and setting  Discharge Instructions You were cared for by a hospitalist during your hospital stay. If you have any questions about your discharge medications or the care you received while you were in the hospital after you are discharged, you can call the unit and asked to speak with the hospitalist on call if the hospitalist that took care of you is not available. Once you are discharged, your primary care physician will handle any further medical issues. Please note that NO REFILLS for any discharge medications will be authorized once you are discharged, as it is imperative that you return to your primary care physician (or establish a relationship with a primary care physician if you do not have one) for your aftercare needs so that they can reassess your need for medications and monitor your lab values.   Allergies as of 09/24/2021   No Known Allergies      Medication List     STOP taking these medications    aspirin-sod bicarb-citric acid 325 MG Tbef tablet Commonly known as: ALKA-SELTZER   chlorthalidone 25 MG tablet Commonly known as: HYGROTON   hydrochlorothiazide 12.5 MG tablet Commonly known as: HYDRODIURIL   lisinopril 40 MG tablet Commonly known as: ZESTRIL   losartan 100 MG tablet Commonly known as: COZAAR   spironolactone 25 MG tablet Commonly known as: ALDACTONE   sulfamethoxazole-trimethoprim 800-160 MG tablet Commonly known as:  BACTRIM DS   tamsulosin 0.4 MG Caps capsule Commonly known as: FLOMAX       TAKE these medications    albuterol 108 (90 Base) MCG/ACT inhaler Commonly known as: VENTOLIN HFA Inhale 2 puffs into the lungs every 4 (four) hours as needed for shortness of breath.   aspirin EC 81 MG tablet Take 81 mg by mouth every evening.   atorvastatin 80 MG tablet Commonly known as: LIPITOR Take 1 tablet by mouth at bedtime.   carvedilol 6.25 MG tablet Commonly known as: COREG Take 1 tablet (6.25 mg total) by mouth 2 (two) times daily. What changed:  medication strength Another medication with the same name was removed. Continue taking this medication, and follow the directions you see here.   FLUoxetine 40 MG capsule Commonly known as: PROZAC Take 1 capsule by mouth daily. What changed: Another medication with the same name was removed. Continue taking this medication, and follow the directions you see here.   gemfibrozil 600 MG tablet Commonly known as: LOPID Take 600 mg by mouth 2 (two) times daily.   hydrALAZINE 100 MG tablet Commonly known as: APRESOLINE Take 1 tablet (100 mg total) by mouth 3 (three) times daily. What changed:  medication strength how much to take  Another medication with the same name was removed. Continue taking this medication, and follow the directions you see here.   Magnesium 250 MG Tabs Take 1 tablet by mouth daily.   metFORMIN 500 MG tablet Commonly known as: GLUCOPHAGE Take 500 mg by mouth daily with breakfast.   omeprazole 20 MG capsule Commonly known as: PRILOSEC Take 20 mg by mouth daily.   Polyethylene Glycol 3350 Powd Take 17 g by mouth daily.   traZODone 100 MG tablet Commonly known as: DESYREL Take 100 mg by mouth at bedtime.       No Known Allergies  Follow-up Information     [facility]. Call in 1 day(s).   Why: Please call for a post hospital follow up appointment. Contact information: [address] [place]  [place] [address] [phone]         Christell Constant, MD. Call today.   Specialty: Cardiology Why: Please call for a posthospital follow-up appointment. Contact information: [address] [place] [address] [phone]                  The results of significant diagnostics from this hospitalization (including imaging, microbiology, ancillary and laboratory) are listed below for reference.    Significant Diagnostic Studies: DG Chest Portable 1 View  Result Date: 09/23/2021 CLINICAL DATA:  Chest pain EXAM: PORTABLE CHEST 1 VIEW COMPARISON:  09/06/2020 FINDINGS: The heart size and mediastinal contours are within normal limits. Both lungs are clear. The visualized skeletal structures are unremarkable. Remote median sternotomy. IMPRESSION: No active disease. Electronically Signed   By: Deatra Robinson M.D.   On: 09/23/2021 03:58   ECHOCARDIOGRAM COMPLETE  Result Date: 09/23/2021    ECHOCARDIOGRAM REPORT   Patient Name:   OSEAS ANSTEAD Date of Exam: 09/23/2021 Medical Rec #:  [id number]  Height:       66.0 in Accession #:    [id number] Weight:       220.4 lb Date of Birth:  1946-06-08 BSA:          2.084 m Patient Age:    74 years   BP:           171/116 mmHg Patient Gender: M          HR:           70 bpm. Exam Location:  Inpatient Procedure: 2D Echo, Cardiac Doppler, Color Doppler and Intracardiac            Opacification Agent Indications:    Chest Pain R07.9  History:        Patient has no prior history of Echocardiogram examinations.                 CAD; Risk Factors:Hypertension, Dyslipidemia and Diabetes.  Sonographer:    Leta Jungling RDCS Referring Phys: [id number] OLADAPO ADEFESO IMPRESSIONS  1. Abnormal septal motion. Left ventricular ejection fraction, by estimation, is 50 to 55%. The left ventricle has low normal function. The left ventricle has no regional wall motion abnormalities. There is mild left ventricular hypertrophy. Left ventricular diastolic parameters  were normal.  2. Right ventricular systolic function is normal. The right ventricular size is normal. There is normal pulmonary artery systolic pressure.  3. Left atrial size was moderately dilated.  4. The mitral valve is abnormal. Trivial mitral valve regurgitation. No evidence of mitral stenosis.  5. The aortic valve is tricuspid. There is mild calcification of the aortic valve. Aortic valve regurgitation is not visualized. Mild  aortic valve sclerosis is present, with no evidence of aortic valve stenosis.  6. The inferior vena cava is normal in size with greater than 50% respiratory variability, suggesting right atrial pressure of 3 mmHg. FINDINGS  Left Ventricle: Abnormal septal motion. Left ventricular ejection fraction, by estimation, is 50 to 55%. The left ventricle has low normal function. The left ventricle has no regional wall motion abnormalities. Definity contrast agent was given IV to delineate the left ventricular endocardial borders. The left ventricular internal cavity size was normal in size. There is mild left ventricular hypertrophy. Left ventricular diastolic parameters were normal. Right Ventricle: The right ventricular size is normal. No increase in right ventricular wall thickness. Right ventricular systolic function is normal. There is normal pulmonary artery systolic pressure. The tricuspid regurgitant velocity is 2.55 m/s, and  with an assumed right atrial pressure of 3 mmHg, the estimated right ventricular systolic pressure is 29.0 mmHg. Left Atrium: Left atrial size was moderately dilated. Right Atrium: Right atrial size was normal in size. Pericardium: There is no evidence of pericardial effusion. Mitral Valve: The mitral valve is abnormal. There is mild thickening of the mitral valve leaflet(s). There is mild calcification of the mitral valve leaflet(s). Trivial mitral valve regurgitation. No evidence of mitral valve stenosis. Tricuspid Valve: The tricuspid valve is normal in structure.  Tricuspid valve regurgitation is trivial. No evidence of tricuspid stenosis. Aortic Valve: The aortic valve is tricuspid. There is mild calcification of the aortic valve. Aortic valve regurgitation is not visualized. Mild aortic valve sclerosis is present, with no evidence of aortic valve stenosis. Pulmonic Valve: The pulmonic valve was normal in structure. Pulmonic valve regurgitation is mild. No evidence of pulmonic stenosis. Aorta: The aortic root is normal in size and structure. Venous: The inferior vena cava is normal in size with greater than 50% respiratory variability, suggesting right atrial pressure of 3 mmHg. IAS/Shunts: No atrial level shunt detected by color flow Doppler.  LEFT VENTRICLE PLAX 2D LVIDd:         5.20 cm      Diastology LVIDs:         3.70 cm      LV e' medial:    5.48 cm/s LV PW:         0.90 cm      LV E/e' medial:  8.4 LV IVS:        1.20 cm      LV e' lateral:   5.67 cm/s LVOT diam:     2.40 cm      LV E/e' lateral: 8.1 LV SV:         57 LV SV Index:   27 LVOT Area:     4.52 cm  LV Volumes (MOD) LV vol d, MOD A2C: 160.0 ml LV vol d, MOD A4C: 166.0 ml LV vol s, MOD A2C: 91.0 ml LV vol s, MOD A4C: 78.7 ml LV SV MOD A2C:     69.0 ml LV SV MOD A4C:     166.0 ml LV SV MOD BP:      78.2 ml RIGHT VENTRICLE RV S prime:     7.35 cm/s TAPSE (M-mode): 1.2 cm LEFT ATRIUM             Index LA diam:        4.50 cm 2.16 cm/m LA Vol (A2C):   70.8 ml 33.97 ml/m LA Vol (A4C):   58.4 ml 28.02 ml/m LA Biplane Vol: 65.3 ml 31.33 ml/m  AORTIC VALVE  LVOT Vmax:   62.15 cm/s LVOT Vmean:  40.050 cm/s LVOT VTI:    0.126 m  AORTA Ao Root diam: 3.90 cm Ao Asc diam:  3.30 cm MITRAL VALVE               TRICUSPID VALVE MV Area (PHT): 3.56 cm    TR Peak grad:   26.0 mmHg MV Decel Time: 213 msec    TR Vmax:        255.00 cm/s MV E velocity: 46.05 cm/s MV A velocity: 62.85 cm/s  SHUNTS MV E/A ratio:  0.73        Systemic VTI:  0.13 m                            Systemic Diam: 2.40 cm Charlton Haws MD Electronically  signed by Charlton Haws MD Signature Date/Time: 09/23/2021/11:08:05 AM    Final     Microbiology: Recent Results (from the past 240 hour(s))  Resp Panel by RT-PCR (Flu A&B, Covid) Nasopharyngeal Swab     Status: None   Collection Time: 09/23/21  5:00 AM   Specimen: Nasopharyngeal Swab; Nasopharyngeal(NP) swabs in vial transport medium  Result Value Ref Range Status   SARS Coronavirus 2 by RT PCR NEGATIVE NEGATIVE Final    Comment: (NOTE) SARS-CoV-2 target nucleic acids are NOT DETECTED.  The SARS-CoV-2 RNA is generally detectable in upper respiratory specimens during the acute phase of infection. The lowest concentration of SARS-CoV-2 viral copies this assay can detect is 138 copies/mL. A negative result does not preclude SARS-Cov-2 infection and should not be used as the sole basis for treatment or other patient management decisions. A negative result may occur with  improper specimen collection/handling, submission of specimen other than nasopharyngeal swab, presence of viral mutation(s) within the areas targeted by this assay, and inadequate number of viral copies(<138 copies/mL). A negative result must be combined with clinical observations, patient history, and epidemiological information. The expected result is Negative.  Fact Sheet for Patients:  BloggerCourse.com  Fact Sheet for Healthcare Providers:  SeriousBroker.it  This test is no t yet approved or cleared by the [place] FDA and  has been authorized for detection and/or diagnosis of SARS-CoV-2 by FDA under an Emergency Use Authorization (EUA). This EUA will remain  in effect (meaning this test can be used) for the duration of the COVID-19 declaration under Section 564(b)(1) of the Act, 21 U.S.C.section 360bbb-3(b)(1), unless the authorization is terminated  or revoked sooner.       Influenza A by PCR NEGATIVE NEGATIVE Final   Influenza B by PCR NEGATIVE  NEGATIVE Final    Comment: (NOTE) The Xpert Xpress SARS-CoV-2/FLU/RSV plus assay is intended as an aid in the diagnosis of influenza from Nasopharyngeal swab specimens and should not be used as a sole basis for treatment. Nasal washings and aspirates are unacceptable for Xpert Xpress SARS-CoV-2/FLU/RSV testing.  Fact Sheet for Patients: BloggerCourse.com  Fact Sheet for Healthcare Providers: SeriousBroker.it  This test is not yet approved or cleared by the [place] FDA and has been authorized for detection and/or diagnosis of SARS-CoV-2 by FDA under an Emergency Use Authorization (EUA). This EUA will remain in effect (meaning this test can be used) for the duration of the COVID-19 declaration under Section 564(b)(1) of the Act, 21 U.S.C. section 360bbb-3(b)(1), unless the authorization is terminated or revoked.  Performed at [facility], [address], [place], [place] [address]  Labs: Basic Metabolic Panel: Recent Labs  Lab 09/23/21 0337 09/24/21 0428  NA 135 135  K 3.8 4.1  CL 102 103  CO2 27 25  GLUCOSE 103* 131*  BUN 13 18  CREATININE 1.29* 1.31*  CALCIUM 9.1 9.4  MG  --  1.6*  PHOS  --  3.8   Liver Function Tests: Recent Labs  Lab 09/24/21 0428  AST 27  ALT 21  ALKPHOS 58  BILITOT 0.7  PROT 6.9  ALBUMIN 3.4*   No results for input(s): LIPASE, AMYLASE in the last 168 hours. No results for input(s): AMMONIA in the last 168 hours. CBC: Recent Labs  Lab 09/23/21 0337 09/24/21 0428  WBC 4.4 3.3*  HGB 11.7* 11.9*  HCT 35.3* 36.3*  MCV 98.6 99.7  PLT 163 153   Cardiac Enzymes: No results for input(s): CKTOTAL, CKMB, CKMBINDEX, TROPONINI in the last 168 hours. BNP: BNP (last 3 results) No results for input(s): BNP in the last 8760 hours.  ProBNP (last 3 results) No results for input(s): PROBNP in the last 8760 hours.  CBG: Recent Labs  Lab 09/23/21 1126 09/23/21 1629 09/23/21 2102  09/24/21 0744 09/24/21 1114  GLUCAP 162* 107* 126* 120* 133*       Signed:  Darlin Drop, MD [facility] 09/24/2021, 1:08 PM

## 2021-10-07 IMAGING — DX DG CHEST 1V PORT
1 series · 1 of 1 positions shown · non-contrast
Comparison: 9528

CLINICAL DATA: Shortness of breath

EXAM:
PORTABLE CHEST 1 VIEW

[chest ap]
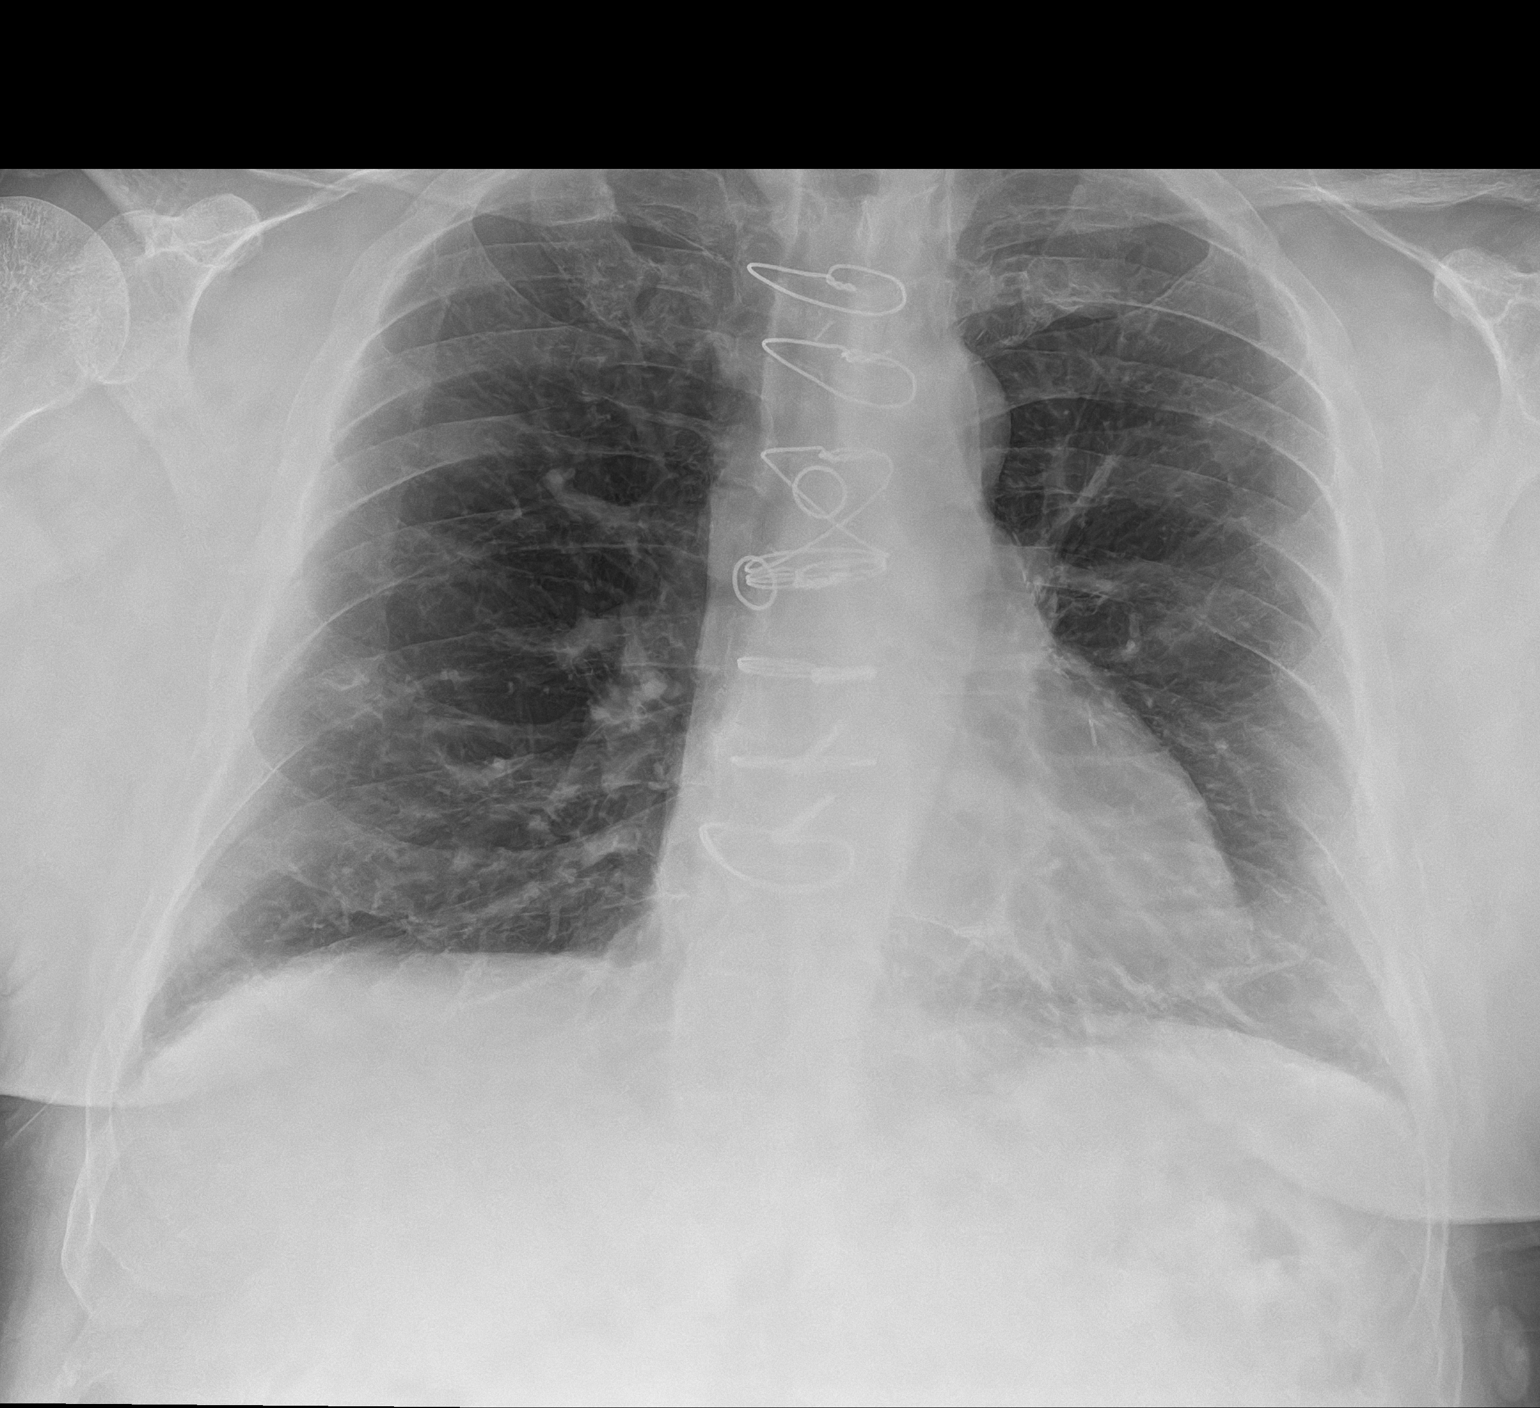

[1 of 1 positions shown; findings below may reference images not displayed]

FINDINGS: Probable background changes of COPD. No consolidation or edema. No
pleural effusion or pneumothorax. Normal heart size for technique.
IMPRESSION: Probable COPD.  No acute abnormality.

## 2021-10-24 NOTE — Progress Notes (Signed)
Cardiology Office Note:    Date:  10/25/2021   ID:  Chad Perry, DOB 23-Jul-1946, MRN [id number]  PCP:  [facility]   [facility] Providers Cardiologist:  Christell Constant, MD     Referring MD: [facility]   CC:   Labile BP f/u  History of Present Illness:    Chad Perry is a 75 y.o. male with a hx of CAD s/p 2019 CABG with unclear anatomy (needs records from [place] at [facility] in 2019), HTN with DM, GERD, orthostatic hypotension who is being seen 09/24/2021 for the evaluation of non cardiac chest pain and shortness of breath in the setting of labile BP 11/22.  See 10/24/21.  Patient notes that he is doing better.  Notes some DOE that has improved with inhaler use.    Notes that he still has some breathing issues but this have improved, kind of similar to prior .  He has labored breathing and chest pain prior to his CABG.  No chest pain or pressure . No PND/Orthopnea.  No weight gain or leg swelling.  No palpitations or syncope.  Ambulatory blood pressure 130/80 but with some low as SBP 100 and some 160.   Past Medical History:  Diagnosis Date   Back pain    CAD (coronary artery disease)    Diabetes mellitus without complication (HCC)    type 2   GERD (gastroesophageal reflux disease)    High cholesterol    Hypertension    Osteoarthritis    knee   Psoriasis    PTSD (post-traumatic stress disorder)     Past Surgical History:  Procedure Laterality Date   CARDIAC SURGERY  11/2016   triple bypass   ELBOW SURGERY     FEMUR FRACTURE SURGERY Right 1970   KNEE SURGERY Right 02/17/2018   replacement   WRIST SURGERY      Current Medications: Current Meds  Medication Sig   albuterol (VENTOLIN HFA) 108 (90 Base) MCG/ACT inhaler Inhale 2 puffs into the lungs every 4 (four) hours as needed for shortness of breath.   aspirin EC 81 MG tablet Take 81 mg by mouth every evening.   atorvastatin (LIPITOR) 80 MG tablet Take 1 tablet by mouth at bedtime.    carvedilol (COREG) 6.25 MG tablet Take 1 tablet (6.25 mg total) by mouth 2 (two) times daily.   FLUoxetine (PROZAC) 40 MG capsule Take 1 capsule by mouth daily.   gemfibrozil (LOPID) 600 MG tablet Take 600 mg by mouth 2 (two) times daily.   hydrALAZINE (APRESOLINE) 100 MG tablet Take 1 tablet (100 mg total) by mouth 3 (three) times daily.   Magnesium 250 MG TABS Take 1 tablet by mouth daily.   metFORMIN (GLUCOPHAGE) 500 MG tablet Take 500 mg by mouth daily with breakfast.    omeprazole (PRILOSEC) 20 MG capsule Take 20 mg by mouth daily.   Polyethylene Glycol 3350 POWD Take 17 g by mouth daily.   traZODone (DESYREL) 100 MG tablet Take 100 mg by mouth at bedtime.    [DISCONTINUED] isosorbide mononitrate (IMDUR) 30 MG 24 hr tablet Take 1 tablet (30 mg total) by mouth daily.     Allergies:   Patient has no known allergies.   Social History   Socioeconomic History   Marital status: Married    Spouse name: [name]   Number of children: 3   Years of education: 12   Highest education level: Not on file  Occupational History  Comment: retired  Tobacco Use   Smoking status: Former   Smokeless tobacco: Never   Tobacco comments:    quit 1995  Substance and Sexual Activity   Alcohol use: No   Drug use: No    Comment: quit 1995   Sexual activity: Not on file  Other Topics Concern   Not on file  Social History Narrative   lives with wife   Caffeine= none   Social Determinants of Health   Financial Resource Strain: Not on file  Food Insecurity: Not on file  Transportation Needs: Not on file  Physical Activity: Not on file  Stress: Not on file  Social Connections: Not on file     Family History: The patient's family history includes Diabetes in his sister; Prostate cancer in his father.  ROS:   Please see the history of present illness.     All other systems reviewed and are negative.  EKGs/Labs/Other Studies Reviewed:    The following studies were reviewed today:  EKG:    09/2021: SR Rate 72 PACs with LVH  Transthoracic Echocardiogram: Date: 09/23/21 Results:  1. Abnormal septal motion. Left ventricular ejection fraction, by  estimation, is 50 to 55%. The left ventricle has low normal function. The  left ventricle has no regional wall motion abnormalities. There is mild  left ventricular hypertrophy. Left  ventricular diastolic parameters were normal.   2. Right ventricular systolic function is normal. The right ventricular  size is normal. There is normal pulmonary artery systolic pressure.   3. Left atrial size was moderately dilated.   4. The mitral valve is abnormal. Trivial mitral valve regurgitation. No  evidence of mitral stenosis.   5. The aortic valve is tricuspid. There is mild calcification of the  aortic valve. Aortic valve regurgitation is not visualized. Mild aortic  valve sclerosis is present, with no evidence of aortic valve stenosis.   6. The inferior vena cava is normal in size with greater than 50%  respiratory variability, suggesting right atrial pressure of 3 mmHg.    Recent Labs: 09/24/2021: ALT 21; BUN 18; Creatinine, Ser 1.31; Hemoglobin 11.9; Magnesium 1.6; Platelets 153; Potassium 4.1; Sodium 135  Recent Lipid Panel No results found for: CHOL, TRIG, HDL, CHOLHDL, VLDL, LDLCALC, LDLDIRECT       Physical Exam:    VS:  BP 138/80   Pulse 82   Ht 5\' 6"  (1.676 m)   Wt 219 lb 3.2 oz (99.4 kg)   SpO2 98%   BMI 35.38 kg/m     Wt Readings from Last 3 Encounters:  10/25/21 219 lb 3.2 oz (99.4 kg)  09/23/21 220 lb 6.4 oz (100 kg)  09/06/20 210 lb (95.3 kg)     Gen: No distress, morbid obesity Neck: No JVD Cardiac: No Rubs or Gallops, no murmur, regular cardia, Respiratory: Clear to auscultation bilaterally, normal effort, normal respiratory rate GI: Soft, nontender, non-distended  MS: No edema;  moves all extremities Integument: Skin feels warm Neuro:  At time of evaluation, alert and oriented to  person/place/time/situation  Psych: Normal affect, patient feels well   ASSESSMENT:    1. Coronary artery disease involving native coronary artery of native heart, unspecified whether angina present   2. Mixed hyperlipidemia   3. Hypertension associated with diabetes (HCC)   4. Morbid obesity (HCC)    PLAN:    Coronary Artery Disease; Obstructive s/p CABG HTN with DM; Morbid Obesity Orthostatic Hypotension (resolved) - asymptomatic - anatomy unclear;  we will need to  get old records ([facility] and anatomy) - continue ASA 81 mg;  - continue atorvastatin 80, gembfibrozil is reasonable, goal LDL < 55 - continue Coreg 0.25 mg PO BID - lipids today (may add zetia) - will add imdur 30 mg PO Daily for his SOB - will see me in 4-5 months         Medication Adjustments/Labs and Tests Ordered: Current medicines are reviewed at length with the patient today.  Concerns regarding medicines are outlined above.  Orders Placed This Encounter  Procedures   Lipid panel    Meds ordered this encounter  Medications   DISCONTD: isosorbide mononitrate (IMDUR) 30 MG 24 hr tablet    Sig: Take 1 tablet (30 mg total) by mouth daily.    Dispense:  90 tablet    Refill:  3   isosorbide mononitrate (IMDUR) 30 MG 24 hr tablet    Sig: Take 1 tablet (30 mg total) by mouth daily.    Dispense:  90 tablet    Refill:  3     Patient Instructions  Medication Instructions:  Your physician has recommended you make the following change in your medication:  START Imdur 30 mg tablets daily  *If you need a refill on your cardiac medications before your next appointment, please call your pharmacy*   Lab Work: Lipid Panel  If you have labs (blood work) drawn today and your tests are completely normal, you will receive your results only by: MyChart Message (if you have MyChart) OR A paper copy in the mail If you have any lab test that is abnormal or we need to change your treatment, we will call  you to review the results.   Testing/Procedures: None   Follow-Up: At [facility], you and your health needs are our priority.  As part of our continuing mission to provide you with exceptional heart care, we have created designated Provider Care Teams.  These Care Teams include your primary Cardiologist (physician) and Advanced Practice Providers (APPs -  Physician Assistants and Nurse Practitioners) who all work together to provide you with the care you need, when you need it.  We recommend signing up for the patient portal called "MyChart".  Sign up information is provided on this After Visit Summary.  MyChart is used to connect with patients for Virtual Visits (Telemedicine).  Patients are able to view lab/test results, encounter notes, upcoming appointments, etc.  Non-urgent messages can be sent to your provider as well.   To learn more about what you can do with MyChart, go to [email or web address].    Your next appointment:   4 month(s)  The format for your next appointment:   In Person  Provider:   Merlyn Lot, MD     Other Instructions     Signed, Christell Constant, MD  10/25/2021 2:47 PM     [facility]

## 2021-10-25 ENCOUNTER — Encounter: Payer: Self-pay | Admitting: Internal Medicine

## 2021-10-25 ENCOUNTER — Ambulatory Visit (INDEPENDENT_AMBULATORY_CARE_PROVIDER_SITE_OTHER): Payer: Medicare (Managed Care) | Admitting: Internal Medicine

## 2021-10-25 ENCOUNTER — Other Ambulatory Visit (HOSPITAL_COMMUNITY)
Admission: RE | Admit: 2021-10-25 | Discharge: 2021-10-25 | Disposition: A | Discharge status: Home / Self Care | Payer: Medicare (Managed Care) | Source: Ambulatory Visit | Attending: Internal Medicine | Admitting: Internal Medicine

## 2021-10-25 VITALS — BP 138/80 | HR 82 | Ht 66.0 in | Wt 219.2 lb

## 2021-10-25 DIAGNOSIS — I2510 Atherosclerotic heart disease of native coronary artery without angina pectoris: Secondary | ICD-10-CM

## 2021-10-25 DIAGNOSIS — E782 Mixed hyperlipidemia: Secondary | ICD-10-CM

## 2021-10-25 DIAGNOSIS — E1159 Type 2 diabetes mellitus with other circulatory complications: Secondary | ICD-10-CM

## 2021-10-25 DIAGNOSIS — I152 Hypertension secondary to endocrine disorders: Secondary | ICD-10-CM

## 2021-10-25 LAB — LIPID PANEL
Cholesterol: 111 mg/dL (ref 0–200)
HDL: 34 mg/dL — ABNORMAL LOW (ref >40)
LDL Cholesterol: 58 mg/dL (ref 0–99)
Total CHOL/HDL Ratio: 3.3 RATIO
Triglycerides: 97 mg/dL (ref <150)
VLDL: 19 mg/dL (ref 0–40)

## 2021-10-25 MED ORDER — ISOSORBIDE MONONITRATE ER 30 MG PO TB24: 30.0000 mg | ORAL_TABLET | Freq: Every day | ORAL | 3 refills | Status: DC

## 2021-10-25 NOTE — Patient Instructions (Signed)
Medication Instructions:  Your physician has recommended you make the following change in your medication:  START Imdur 30 mg tablets daily  *If you need a refill on your cardiac medications before your next appointment, please call your pharmacy*   Lab Work: Lipid Panel  If you have labs (blood work) drawn today and your tests are completely normal, you will receive your results only by: MyChart Message (if you have MyChart) OR A paper copy in the mail If you have any lab test that is abnormal or we need to change your treatment, we will call you to review the results.   Testing/Procedures: None   Follow-Up: At [facility], you and your health needs are our priority.  As part of our continuing mission to provide you with exceptional heart care, we have created designated Provider Care Teams.  These Care Teams include your primary Cardiologist (physician) and Advanced Practice Providers (APPs -  Physician Assistants and Nurse Practitioners) who all work together to provide you with the care you need, when you need it.  We recommend signing up for the patient portal called "MyChart".  Sign up information is provided on this After Visit Summary.  MyChart is used to connect with patients for Virtual Visits (Telemedicine).  Patients are able to view lab/test results, encounter notes, upcoming appointments, etc.  Non-urgent messages can be sent to your provider as well.   To learn more about what you can do with MyChart, go to [email or web address].    Your next appointment:   4 month(s)  The format for your next appointment:   In Person  Provider:   Merlyn Lot, MD     Other Instructions

## 2021-10-29 ENCOUNTER — Telehealth: Payer: Self-pay

## 2021-10-29 NOTE — Telephone Encounter (Signed)
Patient verbalized understanding of results and plan. Pt had no questions or concerns at this time.

## 2021-10-29 NOTE — Telephone Encounter (Signed)
-----   Message from Christell Constant, MD sent at 10/28/2021  2:47 PM EST ----- Results: LDL just above goal for maximal heart attack prevention Plan: Zetia 10 mg PO daily  Christell Constant, MD

## 2021-10-30 MED ORDER — EZETIMIBE 10 MG PO TABS: 10.0000 mg | ORAL_TABLET | Freq: Every day | ORAL | 3 refills | Status: DC

## 2021-10-30 NOTE — Addendum Note (Signed)
Addended by: Leonides Schanz C on: 10/30/2021 01:05 PM   Modules accepted: Orders

## 2021-10-30 NOTE — Telephone Encounter (Signed)
Complete

## 2021-10-30 NOTE — Telephone Encounter (Signed)
Patient is following up. Zetia refill was never sent to the pharmacy. Please assist.   *STAT* If patient is at the pharmacy, call can be transferred to refill team.   1. Which medications need to be refilled? (please list name of each medication and dose if known)  Zetia 10 MG  2. Which pharmacy/location (including street and city if local pharmacy) is medication to be sent to? [facility] [address] [place] - [address]  3. Do they need a 30 day or 90 day supply?

## 2022-02-27 ENCOUNTER — Ambulatory Visit: Payer: No Typology Code available for payment source | Admitting: Internal Medicine

## 2022-02-28 ENCOUNTER — Ambulatory Visit (INDEPENDENT_AMBULATORY_CARE_PROVIDER_SITE_OTHER): Payer: Medicare (Managed Care) | Admitting: Student

## 2022-02-28 ENCOUNTER — Telehealth: Payer: Self-pay

## 2022-02-28 ENCOUNTER — Encounter: Payer: Self-pay | Admitting: Student

## 2022-02-28 VITALS — BP 122/80 | Ht 66.0 in | Wt 219.0 lb

## 2022-02-28 DIAGNOSIS — E785 Hyperlipidemia, unspecified: Secondary | ICD-10-CM | POA: Diagnosis not present

## 2022-02-28 DIAGNOSIS — I10 Essential (primary) hypertension: Secondary | ICD-10-CM

## 2022-02-28 DIAGNOSIS — N1831 Chronic kidney disease, stage 3a: Secondary | ICD-10-CM

## 2022-02-28 DIAGNOSIS — I2510 Atherosclerotic heart disease of native coronary artery without angina pectoris: Secondary | ICD-10-CM | POA: Diagnosis not present

## 2022-02-28 DIAGNOSIS — G4733 Obstructive sleep apnea (adult) (pediatric): Secondary | ICD-10-CM

## 2022-02-28 NOTE — Patient Instructions (Signed)
Medication Instructions:  ?Your physician recommends that you continue on your current medications as directed. Please refer to the Current Medication list given to you today. ? ?*If you need a refill on your cardiac medications before your next appointment, please call your pharmacy* ? ? ?Lab Work: ?NONE  ? ?If you have labs (blood work) drawn today and your tests are completely normal, you will receive your results only by: ?MyChart Message (if you have MyChart) OR ?A paper copy in the mail ?If you have any lab test that is abnormal or we need to change your treatment, we will call you to review the results. ? ? ?Testing/Procedures: ?NONE  ? ? ?Follow-Up: ?At [facility], you and your health needs are our priority.  As part of our continuing mission to provide you with exceptional heart care, we have created designated Provider Care Teams.  These Care Teams include your primary Cardiologist (physician) and Advanced Practice Providers (APPs -  Physician Assistants and Nurse Practitioners) who all work together to provide you with the care you need, when you need it. ? ?We recommend signing up for the patient portal called "MyChart".  Sign up information is provided on this After Visit Summary.  MyChart is used to connect with patients for Virtual Visits (Telemedicine).  Patients are able to view lab/test results, encounter notes, upcoming appointments, etc.  Non-urgent messages can be sent to your provider as well.   ?To learn more about what you can do with MyChart, go to [email or web address].   ? ?Your next appointment:   ?6 month(s) ? ?The format for your next appointment:   ?In Person ? ?Provider:   ?You may see Mahesh A Chandrasekhar, MD or one of the following Advanced Practice Providers on your designated Care Team:   ?Brittany Strader, PA-C  ?Michele Lenze, PA-C   ? ? ?Other Instructions ?Thank you for choosing [facility]! ? ? ? ?Important Information About [place] ? ? ? ? ? ? ?

## 2022-02-28 NOTE — Progress Notes (Signed)
? ?Virtual Visit via Telephone Note  ? ?This visit type was conducted due to national recommendations for restrictions regarding the COVID-19 Pandemic (e.g. social distancing) in an effort to limit this patient's exposure and mitigate transmission in our community.  Due to his co-morbid illnesses, this patient is at least at moderate risk for complications without adequate follow up.  This format is felt to be most appropriate for this patient at this time.  The patient did not have access to video technology/had technical difficulties with video requiring transitioning to audio format only (telephone).  All issues noted in this document were discussed and addressed.  No physical exam could be performed with this format.  Please refer to the patient's chart for his  consent to telehealth for [facility].  ? ? ?Date:  02/28/2022  ? ?IDChantry Perry, DOB [birth year], MRN [id number] ?The patient was identified using 2 identifiers. ? ?Patient Location: Home ?Provider Location: Home Office ? ?PCP:  [facility] [place] ?  ?[facility] Providers ?Cardiologist:  Werner Lean, MD    ? ?Evaluation Performed:  Follow-Up Visit ? ?Chief Complaint:  4 month visit ? ?History of Present Illness:   ? ?Chad Perry is a 76 y.o. male with past medical history of CAD (s/p CABG in 2019), HTN, HLD, GERD, Stage 3 CKD, OSA (on CPAP) and orthostatic hypotension who presents for a 35-month follow-up telehealth visit.  ? ?He was last examined by Dr. Gasper Sells in 10/2021 and denied any recent chest pain but did report dyspnea. He was started on Imdur 30mg  daily and continued on ASA, Atorvastatin, Coreg, Hydralazine and Lopid. FLP in 10/2021 showed his LDL was at 58, therefore Zetia 10mg  daily was added to his medication regimen as well.  ? ?In talking with the patient today, he reports overall feeling well since his last office visit. He is not overly active at baseline but does perform yard work routinely and goes to the  store. He denies any recent exertional chest pain. He reports having intermittent dyspnea and uses Albuterol with improvement in his symptoms. No specific orthopnea, PND or pitting edema. Reports good compliance with his CPAP at night. ? ?His blood pressure has been well controlled when checked at home and he denies any recurrent dizziness resembling his prior orthostasis. ? ?The patient does not have symptoms concerning for COVID-19 infection (fever, chills, cough, or new shortness of breath).  ? ? ?Past Medical History:  ?Diagnosis Date  ? Back pain   ? CAD (coronary artery disease)   ? Diabetes mellitus without complication ([place])   ? type 2  ? GERD (gastroesophageal reflux disease)   ? High cholesterol   ? Hypertension   ? Osteoarthritis   ? knee  ? Psoriasis   ? PTSD (post-traumatic stress disorder)   ? ?Past Surgical History:  ?Procedure Laterality Date  ? CARDIAC SURGERY  11/2016  ? triple bypass  ? ELBOW SURGERY    ? FEMUR FRACTURE SURGERY Right 1970  ? KNEE SURGERY Right 02/17/2018  ? replacement  ? WRIST SURGERY    ?  ? ?Current Meds  ?Medication Sig  ? albuterol (VENTOLIN HFA) 108 (90 Base) MCG/ACT inhaler Inhale 2 puffs into the lungs every 4 (four) hours as needed for shortness of breath.  ? aspirin EC 81 MG tablet Take 81 mg by mouth every evening.  ? atorvastatin (LIPITOR) 80 MG tablet Take 1 tablet by mouth at bedtime.  ? carvedilol (COREG) 6.25 MG tablet Take 1  tablet (6.25 mg total) by mouth 2 (two) times daily.  ? ezetimibe (ZETIA) 10 MG tablet Take 1 tablet (10 mg total) by mouth daily.  ? FLUoxetine (PROZAC) 40 MG capsule Take 1 capsule by mouth daily.  ? gemfibrozil (LOPID) 600 MG tablet Take 600 mg by mouth 2 (two) times daily.  ? hydrALAZINE (APRESOLINE) 100 MG tablet Take 1 tablet (100 mg total) by mouth 3 (three) times daily.  ? isosorbide mononitrate (IMDUR) 30 MG 24 hr tablet Take 1 tablet (30 mg total) by mouth daily.  ? Magnesium 250 MG TABS Take 1 tablet by mouth daily.  ? metFORMIN  (GLUCOPHAGE) 500 MG tablet Take 500 mg by mouth 2 (two) times daily with a meal.  ? omeprazole (PRILOSEC) 20 MG capsule Take 20 mg by mouth daily.  ? Polyethylene Glycol 3350 POWD Take 17 g by mouth daily.  ? traZODone (DESYREL) 100 MG tablet Take 100 mg by mouth at bedtime.   ?  ? ?Allergies:   Patient has no known allergies.  ? ?Social History  ? ?Tobacco Use  ? Smoking status: Former  ? Smokeless tobacco: Never  ? Tobacco comments:  ?  quit 1995  ?Vaping Use  ? Vaping Use: Never used  ?Substance Use Topics  ? Alcohol use: No  ? Drug use: No  ?  Comment: quit 1995  ?  ? ?Family Hx: ?The patient's family history includes Diabetes in his sister; Prostate cancer in his father. ? ?ROS:   ?Please see the history of present illness.    ? ?All other systems reviewed and are negative. ? ? ?Prior CV studies:   ?The following studies were reviewed today: ? ?Echocardiogram: 09/2021 ?IMPRESSIONS  ? ? ? 1. Abnormal septal motion. Left ventricular ejection fraction, by  ?estimation, is 50 to 55%. The left ventricle has low normal function. The  ?left ventricle has no regional wall motion abnormalities. There is mild  ?left ventricular hypertrophy. Left  ?ventricular diastolic parameters were normal.  ? 2. Right ventricular systolic function is normal. The right ventricular  ?size is normal. There is normal pulmonary artery systolic pressure.  ? 3. Left atrial size was moderately dilated.  ? 4. The mitral valve is abnormal. Trivial mitral valve regurgitation. No  ?evidence of mitral stenosis.  ? 5. The aortic valve is tricuspid. There is mild calcification of the  ?aortic valve. Aortic valve regurgitation is not visualized. Mild aortic  ?valve sclerosis is present, with no evidence of aortic valve stenosis.  ? 6. The inferior vena cava is normal in size with greater than 50%  ?respiratory variability, suggesting right atrial pressure of 3 mmHg.  ? ?Labs/Other Tests and Data Reviewed:   ? ?EKG:  No ECG reviewed. ? ?Recent  Labs: ?09/24/2021: ALT 21; BUN 18; Creatinine, Ser 1.31; Hemoglobin 11.9; Magnesium 1.6; Platelets 153; Potassium 4.1; Sodium 135  ? ?Recent Lipid Panel ?Lab Results  ?Component Value Date/Time  ? CHOL 111 10/25/2021 03:31 PM  ? TRIG 97 10/25/2021 03:31 PM  ? HDL 34 (L) 10/25/2021 03:31 PM  ? CHOLHDL 3.3 10/25/2021 03:31 PM  ? [place] 58 10/25/2021 03:31 PM  ? ? ?Wt Readings from Last 3 Encounters:  ?02/28/22 219 lb (99.3 kg)  ?10/25/21 219 lb 3.2 oz (99.4 kg)  ?09/23/21 220 lb 6.4 oz (100 kg)  ?  ? ?Objective:   ? ?Vital Signs:  BP 122/80   Ht 5\' 6"  (1.676 m)   Wt 219 lb (99.3 kg)   BMI 35.35  kg/m?   ? ?General: Pleasant male sounding in NAD ?Psych: Normal affect. ?Neuro: Alert and oriented X 3.  ?Lungs:  Resp regular and unlabored while talking on the phone.  ? ?ASSESSMENT & PLAN:   ? ?1. CAD ?- He is s/p CABG in 2019 and denies any recent anginal symptoms. Continue current medication regimen with ASA 81 mg daily, Atorvastatin 80 mg daily, Coreg 6.25 mg twice daily, Zetia 10 mg daily and Imdur 30 mg daily. ? ?2. HTN ?- His blood pressure has been well-controlled when checked at home, at 122/80 on most recent check. Continue current medication regimen with Coreg 6.25 mg twice daily, Hydralazine 100 mg TID and Imdur 30 mg daily. ? ?3. HLD ?- FLP in 10/2021 showed total cholesterol 111, triglycerides 97, HDL 34 and LDL 58.  Continue current medication regimen with Atorvastatin 80 mg daily, Zetia 10 mg daily and Lopid 600 mg twice daily. ? ?4. Stage 3 CKD ?- His creatinine was at 1.31 in 09/2021 which is close to his known baseline. ? ?5. OSA ?- Continued compliance with CPAP encouraged. ? ? ?COVID-19 Education: ?The signs and symptoms of COVID-19 were discussed with the patient and how to seek care for testing (follow up with PCP or arrange E-visit).  The importance of social distancing was discussed today. ? ?Time:   ?Today, I have spent 12 minutes with the patient with telehealth technology discussing the above  problems.   ? ? ?Medication Adjustments/Labs and Tests Ordered: ?Current medicines are reviewed at length with the patient today.  Concerns regarding medicines are outlined above.  ? ?Tests Ordered: ?No orders of t

## 2022-02-28 NOTE — Telephone Encounter (Signed)
?  Patient Consent for Virtual Visit  ? ? ?   ? ?Chad Perry has provided verbal consent on 02/28/2022 for a virtual visit (video or telephone). ? ? ?CONSENT FOR VIRTUAL VISIT FOR:  Chad Perry  ?By participating in this virtual visit I agree to the following: ? ?I hereby voluntarily request, consent and authorize [facility] and its employed or contracted physicians, physician assistants, nurse practitioners or other licensed health care professionals (the Practitioner), to provide me with telemedicine health care services (the ?Services") as deemed necessary by the treating Practitioner. I acknowledge and consent to receive the Services by the Practitioner via telemedicine. I understand that the telemedicine visit will involve communicating with the Practitioner through live audiovisual communication technology and the disclosure of certain medical information by electronic transmission. I acknowledge that I have been given the opportunity to request an in-person assessment or other available alternative prior to the telemedicine visit and am voluntarily participating in the telemedicine visit. ? ?I understand that I have the right to withhold or withdraw my consent to the use of telemedicine in the course of my care at any time, without affecting my right to future care or treatment, and that the Practitioner or I may terminate the telemedicine visit at any time. I understand that I have the right to inspect all information obtained and/or recorded in the course of the telemedicine visit and may receive copies of available information for a reasonable fee.  I understand that some of the potential risks of receiving the Services via telemedicine include:  ?Delay or interruption in medical evaluation due to technological equipment failure or disruption; ?Information transmitted may not be sufficient (e.g. poor resolution of images) to allow for appropriate medical decision making by the Practitioner; and/or  ?In  rare instances, security protocols could fail, causing a breach of personal health information. ? ?Furthermore, I acknowledge that it is my responsibility to provide information about my medical history, conditions and care that is complete and accurate to the best of my ability. I acknowledge that Practitioner's advice, recommendations, and/or decision may be based on factors not within their control, such as incomplete or inaccurate data provided by me or distortions of diagnostic images or specimens that may result from electronic transmissions. I understand that the practice of medicine is not an exact science and that Practitioner makes no warranties or guarantees regarding treatment outcomes. I acknowledge that a copy of this consent can be made available to me via my patient portal [facility] MyChart), or I can request a printed copy by calling the office of [facility].   ? ?I understand that my insurance will be billed for this visit.  ? ?I have read or had this consent read to me. ?I understand the contents of this consent, which adequately explains the benefits and risks of the Services being provided via telemedicine.  ?I have been provided ample opportunity to ask questions regarding this consent and the Services and have had my questions answered to my satisfaction. ?I give my informed consent for the services to be provided through the use of telemedicine in my medical care ? ? ? ?

## 2022-03-26 DIAGNOSIS — H6691 Otitis media, unspecified, right ear: Secondary | ICD-10-CM | POA: Insufficient documentation

## 2022-03-28 ENCOUNTER — Other Ambulatory Visit: Payer: Self-pay

## 2022-03-28 ENCOUNTER — Emergency Department (HOSPITAL_COMMUNITY)
Admission: EM | Admit: 2022-03-28 | Discharge: 2022-03-28 | Disposition: A | Discharge status: Home / Self Care | Payer: No Typology Code available for payment source | Attending: Emergency Medicine | Admitting: Emergency Medicine

## 2022-03-28 ENCOUNTER — Encounter (HOSPITAL_COMMUNITY): Payer: Self-pay | Admitting: *Deleted

## 2022-03-28 DIAGNOSIS — H9201 Otalgia, right ear: Secondary | ICD-10-CM | POA: Diagnosis present

## 2022-03-28 DIAGNOSIS — Z7982 Long term (current) use of aspirin: Secondary | ICD-10-CM | POA: Insufficient documentation

## 2022-03-28 DIAGNOSIS — H66001 Acute suppurative otitis media without spontaneous rupture of ear drum, right ear: Secondary | ICD-10-CM

## 2022-03-28 MED ORDER — AMOXICILLIN-POT CLAVULANATE 875-125 MG PO TABS: 1.0000 | ORAL_TABLET | Freq: Two times a day (BID) | ORAL | 0 refills | Status: DC

## 2022-03-28 NOTE — Discharge Instructions (Signed)
Stop the cefdinir.  Start taking Augmentin twice daily for the next 7 days.  Follow-up with your PCP Monday for reevaluation if no improvement.  Return to the ED if you start having fevers, drainage, loss of hearing or new or concerning symptoms. ?

## 2022-03-28 NOTE — ED Triage Notes (Signed)
Pt c/o right ear pain; pt states he was seen at urgent care and given antibiotics. Pt has been taking them x 2 days with no relief; pt states the pain radiates to his jaw and up behind his head ?

## 2022-03-28 NOTE — ED Provider Notes (Signed)
?[facility] EMERGENCY DEPARTMENT ?Provider Note ? ? ?CSN: [id number] ?Arrival date & time: 03/28/22  1132 ? ?  ? ?History ? ?Chief Complaint  ?Patient presents with  ? Ear Pain  ? ? ?Chad Perry is a 76 y.o. male. ? ?HPI ? ?  ?Patient presents with right ear pain x3 days.  Was seen at an urgent care 2 days ago and given cefdinir which she has been taking.  States despite taking the cefdinir the pain has not improved.  The pain is to the right ear, occasionally it radiates to his jaw.  Denies any dental problems, no fevers, no ear drainage, no recent travel or barotrauma, no hearing deficits, no neck pain.  ? ?Home Medications ?Prior to Admission medications   ?Medication Sig Start Date End Date Taking? Authorizing Provider  ?albuterol (VENTOLIN HFA) 108 (90 Base) MCG/ACT inhaler Inhale 2 puffs into the lungs every 4 (four) hours as needed for shortness of breath. 04/07/21   [provider]  ?aspirin EC 81 MG tablet Take 81 mg by mouth every evening.    [provider]  ?atorvastatin (LIPITOR) 80 MG tablet Take 1 tablet by mouth at bedtime. 09/14/21   [provider]  ?carvedilol (COREG) 6.25 MG tablet Take 1 tablet (6.25 mg total) by mouth 2 (two) times daily. 09/24/21 02/28/22  Darlin Drop, DO  ?ezetimibe (ZETIA) 10 MG tablet Take 1 tablet (10 mg total) by mouth daily. 10/30/21 02/28/22  Christell Constant, MD  ?FLUoxetine (PROZAC) 40 MG capsule Take 1 capsule by mouth daily. 03/12/21   [provider]  ?gemfibrozil (LOPID) 600 MG tablet Take 600 mg by mouth 2 (two) times daily.    [provider]  ?hydrALAZINE (APRESOLINE) 100 MG tablet Take 1 tablet (100 mg total) by mouth 3 (three) times daily. 09/24/21 02/28/22  Darlin Drop, DO  ?isosorbide mononitrate (IMDUR) 30 MG 24 hr tablet Take 1 tablet (30 mg total) by mouth daily. 10/25/21 02/28/22  Christell Constant, MD  ?Magnesium 250 MG TABS Take 1 tablet by mouth daily.    [provider]  ?metFORMIN  (GLUCOPHAGE) 500 MG tablet Take 500 mg by mouth 2 (two) times daily with a meal.    [provider]  ?omeprazole (PRILOSEC) 20 MG capsule Take 20 mg by mouth daily.    [provider]  ?Polyethylene Glycol 3350 POWD Take 17 g by mouth daily.    [provider]  ?traZODone (DESYREL) 100 MG tablet Take 100 mg by mouth at bedtime.     [provider]  ?   ? ?Allergies    ?Patient has no known allergies.   ? ?Review of Systems   ?Review of Systems ? ?Physical Exam ?Updated Vital Signs ?BP 105/72   Pulse 83   Temp 97.8 ?F (36.6 ?C) (Oral)   Resp 19   Ht 5\' 6"  (1.676 m)   Wt 100.2 kg   SpO2 97%   BMI 35.67 kg/m?  ?Physical Exam ?Vitals and nursing note reviewed. Exam conducted with a chaperone present.  ?Constitutional:   ?   General: He is not in acute distress. ?   Appearance: Normal appearance.  ?HENT:  ?   Head: Normocephalic and atraumatic.  ?   Left Ear: Tympanic membrane normal.  ?   Ears:  ?   Comments: TM is erythematous and bulging.  No drainage in the canal, no vesicular lesions.  TM intact and not perforated. ?   Mouth/Throat:  ?  Comments: No tenderness to percussion of the teeth ?Eyes:  ?   General: No scleral icterus. ?   Extraocular Movements: Extraocular movements intact.  ?   Pupils: Pupils are equal, round, and reactive to light.  ?Skin: ?   Coloration: Skin is not jaundiced.  ?Neurological:  ?   Mental Status: He is alert. Mental status is at baseline.  ?   Coordination: Coordination normal.  ?   Comments: Cranial nerves III through XII are grossly intact, no palsy  ? ? ?ED Results / Procedures / Treatments   ?Labs ?(all labs ordered are listed, but only abnormal results are displayed) ?Labs Reviewed - No data to display ? ?EKG ?None ? ?Radiology ?No results found. ? ?Procedures ?Procedures  ? ? ?Medications Ordered in ED ?Medications - No data to display ? ?ED Course/ Medical Decision Making/ A&P ?  ?                        ?Medical Decision  Making ? ?Patient presents with right AOM.  His vitals are stable, he is not febrile.  No drainage, facial nerve palsies, pain with dentition obesity as above dental pain.  Considered mastoiditis but auricle is not inflamed.  There is also no tragal tenderness. ? ?It has been resistant to the cefdinir antibiotics.  I considered necrotizing fasciitis, not consistent with exam and presentation.  We will switch patient to Augmentin and have him follow-up with his PCP in the next 2 days for reevaluation.  Return precautions were discussed, discharged in stable condition. ? ? ? ? ? ? ? ?Final Clinical Impression(s) / ED Diagnoses ?Final diagnoses:  ?None  ? ? ?Rx / DC Orders ?ED Discharge Orders   ? ? None  ? ?  ? ? ?  ?Theron Arista, PA-C ?03/28/22 1328 ? ?  ?Vanetta Mulders, MD ?03/31/22 [phone] ? ?

## 2022-06-12 ENCOUNTER — Encounter (HOSPITAL_COMMUNITY): Payer: Self-pay

## 2022-06-12 ENCOUNTER — Emergency Department (HOSPITAL_COMMUNITY)
Admission: EM | Admit: 2022-06-12 | Discharge: 2022-06-12 | Disposition: A | Discharge status: Home / Self Care | Payer: No Typology Code available for payment source | Attending: Emergency Medicine | Admitting: Emergency Medicine

## 2022-06-12 ENCOUNTER — Emergency Department (HOSPITAL_COMMUNITY): Payer: No Typology Code available for payment source

## 2022-06-12 ENCOUNTER — Other Ambulatory Visit: Payer: Self-pay

## 2022-06-12 DIAGNOSIS — E119 Type 2 diabetes mellitus without complications: Secondary | ICD-10-CM | POA: Diagnosis not present

## 2022-06-12 DIAGNOSIS — Z7984 Long term (current) use of oral hypoglycemic drugs: Secondary | ICD-10-CM | POA: Insufficient documentation

## 2022-06-12 DIAGNOSIS — W01198A Fall on same level from slipping, tripping and stumbling with subsequent striking against other object, initial encounter: Secondary | ICD-10-CM | POA: Diagnosis not present

## 2022-06-12 DIAGNOSIS — I2510 Atherosclerotic heart disease of native coronary artery without angina pectoris: Secondary | ICD-10-CM | POA: Diagnosis not present

## 2022-06-12 DIAGNOSIS — I10 Essential (primary) hypertension: Secondary | ICD-10-CM | POA: Diagnosis not present

## 2022-06-12 DIAGNOSIS — Z79899 Other long term (current) drug therapy: Secondary | ICD-10-CM | POA: Diagnosis not present

## 2022-06-12 DIAGNOSIS — M25511 Pain in right shoulder: Secondary | ICD-10-CM | POA: Diagnosis present

## 2022-06-12 NOTE — Discharge Instructions (Signed)
You have been seen here for right shoulder pain. I recommend taking over-the-counter pain medications like Tylenol every 6 as needed.  Please follow dosage and on the back of bottle.  I also recommend applying heat to the area and stretching out the muscles as this will help decrease stiffness and pain.  Follow-up with Ortho as needed  Come back to the emergency department if you develop chest pain, shortness of breath, severe abdominal pain, uncontrolled nausea, vomiting, diarrhea.

## 2022-06-12 NOTE — ED Triage Notes (Signed)
Pt presents to ED after falling off trailer yesterday and landing on right shoulder. Pt reports pain to right shoulder.

## 2022-06-12 NOTE — ED Provider Notes (Signed)
[facility] EMERGENCY DEPARTMENT Provider Note   CSN: [id number] Arrival date & time: 06/12/22  0754     History  Chief Complaint  Patient presents with   Shoulder Injury    Chad Perry is a 76 y.o. male.  HPI  Medical history including diabetes, CAD, hypertension presents with complaints of right shoulder pain.  Patient started yesterday, states that he was on a trailer slipped and fell onto his right shoulder.  Denies hitting his head losing conscious, he is not on anticoag.  Patient states that he has pain in her right shoulder, does not radiate, remains constant, denies any paresthesia or weakness in his arm, denies any neck pain back pain chest pain abdominal pain pain in the other 3 extremities.    Home Medications Prior to Admission medications   Medication Sig Start Date End Date Taking? Authorizing Provider  albuterol (VENTOLIN HFA) 108 (90 Base) MCG/ACT inhaler Inhale 2 puffs into the lungs every 4 (four) hours as needed for shortness of breath. 04/07/21  Yes [provider]  chlorthalidone (HYGROTON) 25 MG tablet Take 12.5 mg by mouth daily.   Yes [provider]  ezetimibe (ZETIA) 10 MG tablet Take 1 tablet (10 mg total) by mouth daily. 10/30/21 06/12/22 Yes Chandrasekhar, Mahesh A, MD  fluticasone-salmeterol (WIXELA INHUB) 100-50 MCG/ACT AEPB Inhale 1 puff into the lungs 2 (two) times daily.   Yes [provider]  Garlic 1000 MG CAPS Take 1 capsule by mouth daily.   Yes [provider]  hydrALAZINE (APRESOLINE) 100 MG tablet Take 1 tablet (100 mg total) by mouth 3 (three) times daily. 09/24/21 06/12/22 Yes Darlin Drop, DO  isosorbide mononitrate (IMDUR) 30 MG 24 hr tablet Take 1 tablet (30 mg total) by mouth daily. 10/25/21 06/12/22 Yes Chandrasekhar, Mahesh A, MD  losartan (COZAAR) 100 MG tablet Take 100 mg by mouth daily.   Yes [provider]  metFORMIN (GLUCOPHAGE) 500 MG tablet Take 500 mg by mouth 2 (two) times daily with a  meal.   Yes [provider]  omeprazole (PRILOSEC) 20 MG capsule Take 20 mg by mouth daily.   Yes [provider]  tamsulosin (FLOMAX) 0.4 MG CAPS capsule Take 0.4 mg by mouth daily.   Yes [provider]  amoxicillin-clavulanate (AUGMENTIN) 875-125 MG tablet Take 1 tablet by mouth every 12 (twelve) hours. Patient not taking: Reported on 06/12/2022 03/28/22   Theron Arista, PA-C  carvedilol (COREG) 6.25 MG tablet Take 1 tablet (6.25 mg total) by mouth 2 (two) times daily. 09/24/21 02/28/22  Darlin Drop, DO      Allergies    Patient has no known allergies.    Review of Systems   Review of Systems  Constitutional:  Negative for chills and fever.  Respiratory:  Negative for shortness of breath.   Cardiovascular:  Negative for chest pain.  Gastrointestinal:  Negative for abdominal pain.  Musculoskeletal:        Right shoulder pain   Neurological:  Negative for headaches.    Physical Exam Updated Vital Signs BP 115/90 (BP Location: Left Arm)   Pulse 70   Temp 98 F (36.7 C) (Oral)   Resp 14   Ht 5\' 6"  (1.676 m)   Wt 99.3 kg   SpO2 99%   BMI 35.35 kg/m  Physical Exam Vitals and nursing note reviewed.  Constitutional:      General: He is not in acute distress.    Appearance: He is not ill-appearing.  HENT:  Head: Normocephalic and atraumatic.     Comments: No deformity of the head present no raccoon eyes or battle sign noted.    Nose: No congestion.     Mouth/Throat:     Mouth: Mucous membranes are moist.     Pharynx: Oropharynx is clear.     Comments: No trismus no torticollis no oral trauma. Eyes:     Extraocular Movements: Extraocular movements intact.     Conjunctiva/sclera: Conjunctivae normal.  Cardiovascular:     Rate and Rhythm: Normal rate and regular rhythm.     Pulses: Normal pulses.     Heart sounds: No murmur heard.    No friction rub. No gallop.  Pulmonary:     Effort: No respiratory distress.     Breath sounds: No wheezing,  rhonchi or rales.     Comments: Chest was nontender. Abdominal:     Palpations: Abdomen is soft.     Tenderness: There is no abdominal tenderness. There is no right CVA tenderness or left CVA tenderness.  Musculoskeletal:     Comments: Spine was palpated nontender to palpation no step-off or deformities noted.  No pelvis instability.  Patient had focal tenderness on his deltoid, without crepitus or deformities noted.  Full range of motion his fingers wrist and elbow, 2+ radial pulses.  Neurovascular fully intact in the right arm.  Skin:    General: Skin is warm and dry.     Comments: No evidence of trauma on patient's chest or abdomen.  Neurological:     Mental Status: He is alert.  Psychiatric:        Mood and Affect: Mood normal.     ED Results / Procedures / Treatments   Labs (all labs ordered are listed, but only abnormal results are displayed) Labs Reviewed - No data to display  EKG None  Radiology DG Shoulder Right  Result Date: 06/12/2022 CLINICAL DATA:  [place] yesterday.  Injured right shoulder. EXAM: RIGHT SHOULDER - 2+ VIEW COMPARISON:  None Available. FINDINGS: The glenohumeral joint is maintained. Mild to moderate degenerative changes noted at the [facility] joint. No acute fracture or dislocation. The clavicle and scapula are intact. The visualized right ribs are intact. IMPRESSION: No fracture or dislocation. Electronically Signed   By: Marijo Sanes M.D.   On: 06/12/2022 08:39    Procedures Procedures    Medications Ordered in ED Medications - No data to display  ED Course/ Medical Decision Making/ A&P                           Medical Decision Making Amount and/or Complexity of Data Reviewed Radiology: ordered.   This patient presents to the ED for concern of right shoulder pain, this involves an extensive number of treatment options, and is a complaint that carries with it a high risk of complications and morbidity.  The differential diagnosis includes fracture,  dislocation, compartment syndrome    Additional history obtained:  Additional history obtained from N/A External records from outside source obtained and reviewed including PCP notes   Co morbidities that complicate the patient evaluation  Hypertension  Social Determinants of Health:  Geriatric    Lab Tests:  I Ordered, and personally interpreted labs.  The pertinent results include: N/A   Imaging Studies ordered:  I ordered imaging studies including DG of right shoulder I independently visualized and interpreted imaging which showed negative acute findings I agree with the radiologist interpretation   Cardiac Monitoring:  The patient was maintained on a cardiac monitor.  I personally viewed and interpreted the cardiac monitored which showed an underlying rhythm of: N/A   Medicines ordered and prescription drug management:  I ordered medication including N/A I have reviewed the patients home medicines and have made adjustments as needed  Critical Interventions:  N/A   Reevaluation:  Presents with right shoulder pain triage obtained imaging which I personally reviewed they are unremarkable.  Patient with benign physical exam, he is agreement with plan discharge at this time.  Consultations Obtained:  N/A    Test Considered:  CT head deferred-no head trauma, no evidence of trauma on my exam, not endorsing any headaches change in vision paresthesias or weakness to upper or lower extremities.    Rule out I have low suspicion for septic arthritis as patient denies IV drug use, skin exam was performed no erythematous, edematous, warm joints noted on exam, no new heart murmur heard on exam.  Low suspicion for fracture or dislocation as x-ray does not feel any significant findings. low suspicion for ligament or tendon damage as area was palpated no gross defects noted, they had full range of motion as well as 5/5 strength.  Low suspicion for compartment  syndrome as area was palpated it was soft to the touch, neurovascular fully intact.     Dispostion and problem list  After consideration of the diagnostic results and the patients response to treatment, I feel that the patent would benefit from discharge.  Right shoulder pain-likely muscular, recommend over-the-counter pain medications, will provide a muscle relaxer, follow-up with Ortho as needed strict return precautions.            Final Clinical Impression(s) / ED Diagnoses Final diagnoses:  Acute pain of right shoulder    Rx / DC Orders ED Discharge Orders     None         Carroll Sage, PA-C 06/12/22 0957    Bethann Berkshire, MD 06/13/22 1751

## 2022-08-29 NOTE — Progress Notes (Signed)
Cardiology Office Note:    Date:  08/30/2022   ID:  Chad Perry, DOB Mar 09, 1946, MRN [id number]  PCP:  [facility] [place] [facility] Cardiologist:  Chad Lean, MD     Referring MD: [facility] [place]   CC:   CAD  History of Present Illness:    Chad Perry is a 76 y.o. male with a hx of CAD s/p 2019 CABG with unclear anatomy (needs records from [place] at [facility] in 2019), HTN with DM, GERD, orthostatic hypotension who is being seen 09/24/2021 for the evaluation of non cardiac chest pain and shortness of breath in the setting of labile BP 11/22.  See 10/24/21. 2022: Imdur added. 2023: Saw PA in April; no sx. We did not receive Chad Perry records.  Patient notes that he is doing well.    There are no interval hospital/ED visit.    No chest pain or pressure .  No SOB/DOE and no PND/Orthopnea.  No weight gain or leg swelling.  No palpitations or syncope .  He is being treated for obstructive lung disease and is doing well.  His anginal equivalent was chest pain, SOB, that kept him from getting out of the care out of church.  He eats once a day.  Sometimes fasts because of his wife's foot problems. Does eat a bit of fast food.   Past Medical History:  Diagnosis Date   Back pain    CAD (coronary artery disease)    Diabetes mellitus without complication (HCC)    type 2   GERD (gastroesophageal reflux disease)    High cholesterol    Hypertension    Osteoarthritis    knee   Psoriasis    PTSD (post-traumatic stress disorder)     Past Surgical History:  Procedure Laterality Date   CARDIAC SURGERY  11/2016   triple bypass   ELBOW SURGERY     FEMUR FRACTURE SURGERY Right 1970   KNEE SURGERY Right 02/17/2018   replacement   WRIST SURGERY      Current Medications: Current Meds  Medication Sig   albuterol (VENTOLIN HFA) 108 (90 Base) MCG/ACT inhaler Inhale 2 puffs into the lungs every 4 (four) hours as needed for shortness of breath.    atorvastatin (LIPITOR) 80 MG tablet Take 1 tablet by mouth at bedtime.   carvedilol (COREG) 6.25 MG tablet Take 1 tablet (6.25 mg total) by mouth 2 (two) times daily.   cetirizine (ZYRTEC) 10 MG tablet Take 10 mg by mouth daily as needed.   chlorthalidone (HYGROTON) 25 MG tablet Take 12.5 mg by mouth daily.   ezetimibe (ZETIA) 10 MG tablet Take 1 tablet (10 mg total) by mouth daily.   fluticasone-salmeterol (WIXELA INHUB) 100-50 MCG/ACT AEPB Inhale 1 puff into the lungs 2 (two) times daily.   Garlic 7829 MG CAPS Take 1 capsule by mouth daily.   hydrALAZINE (APRESOLINE) 100 MG tablet Take 1 tablet (100 mg total) by mouth 3 (three) times daily.   isosorbide mononitrate (IMDUR) 30 MG 24 hr tablet Take 1 tablet (30 mg total) by mouth daily.   losartan (COZAAR) 100 MG tablet Take 100 mg by mouth daily.   metFORMIN (GLUCOPHAGE) 500 MG tablet Take 500 mg by mouth 2 (two) times daily with a meal.   omeprazole (PRILOSEC) 20 MG capsule Take 20 mg by mouth daily.   tamsulosin (FLOMAX) 0.4 MG CAPS capsule Take 0.4 mg by mouth daily.     Allergies:   Patient  has no known allergies.   Social History   Socioeconomic History   Marital status: Married    Spouse name: Clarene Critchley   Number of children: 3   Years of education: 12   Highest education level: Not on file  Occupational History    Comment: retired  Tobacco Use   Smoking status: Former   Smokeless tobacco: Never   Tobacco comments:    quit 1995  Vaping Use   Vaping Use: Never used  Substance and Sexual Activity   Alcohol use: No   Drug use: No    Comment: quit 1995   Sexual activity: Not on file  Other Topics Concern   Not on file  Social History Narrative   lives with wife   Caffeine= none   Social Determinants of Health   Financial Resource Strain: Not on file  Food Insecurity: Not on file  Transportation Needs: Not on file  Physical Activity: Not on file  Stress: Not on file  Social Connections: Not on file     Family  History: The patient's family history includes Diabetes in his sister; Prostate cancer in his father.  ROS:   Please see the history of present illness.     All other systems reviewed and are negative.  EKGs/Labs/Other Studies Reviewed:    The following studies were reviewed today:  EKG:   08/30/22: SR with LAE and PACs, Qtc 48ms 09/2021: SR Rate 72 PACs with LVH  Transthoracic Echocardiogram: Date: 09/23/21 Results:  1. Abnormal septal motion. Left ventricular ejection fraction, by  estimation, is 50 to 55%. The left ventricle has low normal function. The  left ventricle has no regional wall motion abnormalities. There is mild  left ventricular hypertrophy. Left  ventricular diastolic parameters were normal.   2. Right ventricular systolic function is normal. The right ventricular  size is normal. There is normal pulmonary artery systolic pressure.   3. Left atrial size was moderately dilated.   4. The mitral valve is abnormal. Trivial mitral valve regurgitation. No  evidence of mitral stenosis.   5. The aortic valve is tricuspid. There is mild calcification of the  aortic valve. Aortic valve regurgitation is not visualized. Mild aortic  valve sclerosis is present, with no evidence of aortic valve stenosis.   6. The inferior vena cava is normal in size with greater than 50%  respiratory variability, suggesting right atrial pressure of 3 mmHg.    Recent Labs: 09/24/2021: ALT 21; BUN 18; Creatinine, Ser 1.31; Hemoglobin 11.9; Magnesium 1.6; Platelets 153; Potassium 4.1; Sodium 135  Recent Lipid Panel    Component Value Date/Time   CHOL 111 10/25/2021 1531   TRIG 97 10/25/2021 1531   HDL 34 (L) 10/25/2021 1531   CHOLHDL 3.3 10/25/2021 1531   VLDL 19 10/25/2021 1531   LDLCALC 58 10/25/2021 1531       Physical Exam:    VS:  BP (!) 102/58   Pulse 84   Ht 5\' 6"  (1.676 m)   Wt 222 lb (100.7 kg)   SpO2 97%   BMI 35.83 kg/m     Wt Readings from Last 3 Encounters:   08/30/22 222 lb (100.7 kg)  06/12/22 219 lb (99.3 kg)  03/28/22 221 lb (100.2 kg)    Gen: No distress, morbid obesity Neck: No JVD Cardiac: No Rubs or Gallops, no murmur, regular rhythm Respiratory: Clear to auscultation bilaterally, normal effort, normal respiratory rate GI: Soft, nontender, non-distended  MS: No edema;  moves all extremities Integument:  Skin feels warm Neuro:  At time of evaluation, alert and oriented to person/place/time/situation  Psych: Normal affect, patient feels well  ASSESSMENT:    1. Coronary artery disease involving native coronary artery of native heart, unspecified whether angina present   2. Mixed hyperlipidemia   3. Hypertension associated with diabetes ([place])   4. Morbid obesity ([place])   5. Prolonged QT interval   6. Type 2 diabetes mellitus with stage 3a chronic kidney disease, without long-term current use of insulin (HCC)     PLAN:    Coronary Artery Disease; Obstructive s/p CABG HTN with DM; Morbid Obesity Orthostatic Hypotension (resolved) CKD - asymptomatic- his anginal equivalent his chest pain - anatomy unclear;  we never received old records from the [place] - continue ASA 81 mg;  - continue atorvastatin 80, gembfibrozil is reasonable, goal LDL < 70 - continue Coreg 6.25 mg PO BID - continue Imdur - continue ARB - we will check fasting and lipids this week - we had discussed cutting fast food intake and increase in activity - if having hypotension again (none since last visit), can stop CTZ and re assess; he has no sx at thsi BP     Prolonged Qtc PACs - will get EKG today - ADDENDUM: ST with PACs and Qtc 469 (average)  Six months with new primary cardiologist  Medication Adjustments/Labs and Tests Ordered: Current medicines are reviewed at length with the patient today.  Concerns regarding medicines are outlined above.  Orders Placed This Encounter  Procedures   EKG 12-Lead    No orders of the defined types were placed in  this encounter.    There are no Patient Instructions on file for this visit.   Signed, Chad Lean, MD  08/30/2022 1:16 PM    [facility]

## 2022-08-30 ENCOUNTER — Encounter: Payer: Self-pay | Admitting: Internal Medicine

## 2022-08-30 ENCOUNTER — Ambulatory Visit: Payer: No Typology Code available for payment source | Attending: Internal Medicine | Admitting: Internal Medicine

## 2022-08-30 VITALS — BP 102/58 | HR 84 | Ht 66.0 in | Wt 222.0 lb

## 2022-08-30 DIAGNOSIS — I2510 Atherosclerotic heart disease of native coronary artery without angina pectoris: Secondary | ICD-10-CM

## 2022-08-30 DIAGNOSIS — E1159 Type 2 diabetes mellitus with other circulatory complications: Secondary | ICD-10-CM | POA: Diagnosis not present

## 2022-08-30 DIAGNOSIS — R9431 Abnormal electrocardiogram [ECG] [EKG]: Secondary | ICD-10-CM

## 2022-08-30 DIAGNOSIS — I152 Hypertension secondary to endocrine disorders: Secondary | ICD-10-CM

## 2022-08-30 DIAGNOSIS — E1122 Type 2 diabetes mellitus with diabetic chronic kidney disease: Secondary | ICD-10-CM

## 2022-08-30 DIAGNOSIS — E782 Mixed hyperlipidemia: Secondary | ICD-10-CM | POA: Diagnosis not present

## 2022-08-30 DIAGNOSIS — N1831 Chronic kidney disease, stage 3a: Secondary | ICD-10-CM

## 2022-08-30 NOTE — Patient Instructions (Signed)
Medication Instructions:  Your physician recommends that you continue on your current medications as directed. Please refer to the Current Medication list given to you today.   Labwork: Fasting lipids, ALT  Testing/Procedures: None today  Follow-Up: 6 months  Any Other Special Instructions Will Be Listed Below (If Applicable).  If you need a refill on your cardiac medications before your next appointment, please call your pharmacy.

## 2022-09-17 ENCOUNTER — Other Ambulatory Visit (HOSPITAL_COMMUNITY)
Admission: RE | Admit: 2022-09-17 | Discharge: 2022-09-17 | Disposition: A | Discharge status: Home / Self Care | Payer: Medicare (Managed Care) | Source: Ambulatory Visit | Attending: Internal Medicine | Admitting: Internal Medicine

## 2022-09-17 DIAGNOSIS — I2510 Atherosclerotic heart disease of native coronary artery without angina pectoris: Secondary | ICD-10-CM | POA: Insufficient documentation

## 2022-09-17 LAB — LIPID PANEL
Cholesterol: 72 mg/dL (ref 0–200)
HDL: 28 mg/dL — ABNORMAL LOW (ref >40)
LDL Cholesterol: 26 mg/dL (ref 0–99)
Total CHOL/HDL Ratio: 2.6 RATIO
Triglycerides: 89 mg/dL (ref <150)
VLDL: 18 mg/dL (ref 0–40)

## 2022-09-17 LAB — ALT: ALT: 28 U/L (ref 0–44)

## 2022-10-26 ENCOUNTER — Other Ambulatory Visit: Payer: Self-pay | Admitting: Internal Medicine

## 2022-11-22 ENCOUNTER — Telehealth: Payer: Self-pay | Admitting: Internal Medicine

## 2022-11-22 MED ORDER — ISOSORBIDE MONONITRATE ER 30 MG PO TB24: 30.0000 mg | ORAL_TABLET | Freq: Every day | ORAL | 3 refills | Status: DC

## 2022-11-22 NOTE — Telephone Encounter (Signed)
Pt came in office and stated that he needs an isosorbide mononitrate renewal sent in. Send one refill to [facility] on [address] in [place] and the rest sent to [facility]. He runs out in 4 days and wont receive from [place] for about 10 days.

## 2022-11-22 NOTE — Telephone Encounter (Signed)
Medication refill complete as requested.

## 2023-03-17 ENCOUNTER — Ambulatory Visit: Payer: No Typology Code available for payment source | Attending: Internal Medicine | Admitting: Internal Medicine

## 2023-03-17 ENCOUNTER — Encounter: Payer: Self-pay | Admitting: Internal Medicine

## 2023-03-17 VITALS — BP 112/60 | HR 62 | Ht 66.0 in | Wt 212.0 lb

## 2023-03-17 DIAGNOSIS — E782 Mixed hyperlipidemia: Secondary | ICD-10-CM

## 2023-03-17 DIAGNOSIS — I152 Hypertension secondary to endocrine disorders: Secondary | ICD-10-CM

## 2023-03-17 DIAGNOSIS — I2510 Atherosclerotic heart disease of native coronary artery without angina pectoris: Secondary | ICD-10-CM

## 2023-03-17 DIAGNOSIS — E1159 Type 2 diabetes mellitus with other circulatory complications: Secondary | ICD-10-CM | POA: Diagnosis not present

## 2023-03-17 NOTE — Patient Instructions (Signed)
Medication Instructions:  Your physician recommends that you continue on your current medications as directed. Please refer to the Current Medication list given to you today.  *If you need a refill on your cardiac medications before your next appointment, please call your pharmacy*   Lab Work: None If you have labs (blood work) drawn today and your tests are completely normal, you will receive your results only by: MyChart Message (if you have MyChart) OR A paper copy in the mail If you have any lab test that is abnormal or we need to change your treatment, we will call you to review the results.   Testing/Procedures: None   Follow-Up: At [facility], you and your health needs are our priority.  As part of our continuing mission to provide you with exceptional heart care, we have created designated Provider Care Teams.  These Care Teams include your primary Cardiologist (physician) and Advanced Practice Providers (APPs -  Physician Assistants and Nurse Practitioners) who all work together to provide you with the care you need, when you need it.  We recommend signing up for the patient portal called "MyChart".  Sign up information is provided on this After Visit Summary.  MyChart is used to connect with patients for Virtual Visits (Telemedicine).  Patients are able to view lab/test results, encounter notes, upcoming appointments, etc.  Non-urgent messages can be sent to your provider as well.   To learn more about what you can do with MyChart, go to [email or web address].    Your next appointment:   1 year(s)  Provider:   Vishnu Mallipeddi, MD    Other Instructions    

## 2023-03-17 NOTE — Progress Notes (Signed)
Cardiology Office Note  Date: 03/17/2023   ID: Chad Perry, DOB 06/26/1946, MRN [id number]  PCP:  [facility]  Cardiologist:  Christell Constant, MD Electrophysiologist:  None   Reason for Office Visit: Follow-up of CABG   History of Present Illness: Chad Perry is a 77 y.o. male known to have CAD s/p CABG in 2019 (unclear anatomy, need records from [place]), HTN, DM 2, GERD, orthostatic hypotension presents to the clinic for follow-up visit.  Patient has some twitching pain in the chest especially at rest.  No chest pain with exertion. Physically active at baseline.  He does have SOB but no recent worsening. No leg swelling, palpitations or syncope.  He does have some occasional dizziness.  Does not check blood pressures at home.  Past Medical History:  Diagnosis Date   Back pain    CAD (coronary artery disease)    Diabetes mellitus without complication (HCC)    type 2   GERD (gastroesophageal reflux disease)    High cholesterol    Hypertension    Osteoarthritis    knee   Psoriasis    PTSD (post-traumatic stress disorder)     Past Surgical History:  Procedure Laterality Date   CARDIAC SURGERY  11/2016   triple bypass   ELBOW SURGERY     FEMUR FRACTURE SURGERY Right 1970   KNEE SURGERY Right 02/17/2018   replacement   WRIST SURGERY      Current Outpatient Medications  Medication Sig Dispense Refill   albuterol (VENTOLIN HFA) 108 (90 Base) MCG/ACT inhaler Inhale 2 puffs into the lungs every 4 (four) hours as needed for shortness of breath.     amoxicillin-clavulanate (AUGMENTIN) 875-125 MG tablet Take 1 tablet by mouth every 12 (twelve) hours. (Patient not taking: Reported on 06/12/2022) 14 tablet 0   atorvastatin (LIPITOR) 80 MG tablet Take 1 tablet by mouth at bedtime.     carvedilol (COREG) 6.25 MG tablet Take 1 tablet (6.25 mg total) by mouth 2 (two) times daily. 60 tablet 0   cetirizine (ZYRTEC) 10 MG tablet Take 10 mg by mouth daily as needed.      chlorthalidone (HYGROTON) 25 MG tablet Take 12.5 mg by mouth daily.     ezetimibe (ZETIA) 10 MG tablet Take 1 tablet by mouth once daily 90 tablet 1   fluticasone-salmeterol (WIXELA INHUB) 100-50 MCG/ACT AEPB Inhale 1 puff into the lungs 2 (two) times daily.     Garlic 1000 MG CAPS Take 1 capsule by mouth daily.     hydrALAZINE (APRESOLINE) 100 MG tablet Take 1 tablet (100 mg total) by mouth 3 (three) times daily. 90 tablet 0   isosorbide mononitrate (IMDUR) 30 MG 24 hr tablet Take 1 tablet (30 mg total) by mouth daily. 90 tablet 3   losartan (COZAAR) 100 MG tablet Take 100 mg by mouth daily.     metFORMIN (GLUCOPHAGE) 500 MG tablet Take 500 mg by mouth 2 (two) times daily with a meal.     omeprazole (PRILOSEC) 20 MG capsule Take 20 mg by mouth daily.     tamsulosin (FLOMAX) 0.4 MG CAPS capsule Take 0.4 mg by mouth daily.     No current facility-administered medications for this visit.   Allergies:  Patient has no known allergies.   Social History: The patient  reports that he has quit smoking. He has never used smokeless tobacco. He reports that he does not drink alcohol and does not use drugs.   Family History: The patient's  family history includes Diabetes in his sister; Prostate cancer in his father.   ROS:  Please see the history of present illness. Otherwise, complete review of systems is positive for none.  All other systems are reviewed and negative.   Physical Exam: VS:  Pulse 72   Ht 5\' 6"  (1.676 m)   Wt 212 lb (96.2 kg)   SpO2 95%   BMI 34.22 kg/m , BMI Body mass index is 34.22 kg/m.  Wt Readings from Last 3 Encounters:  03/17/23 212 lb (96.2 kg)  08/30/22 222 lb (100.7 kg)  06/12/22 219 lb (99.3 kg)    General: Patient appears comfortable at rest. HEENT: Conjunctiva and lids normal, oropharynx clear with moist mucosa. Neck: Supple, no elevated JVP or carotid bruits, no thyromegaly. Lungs: Clear to auscultation, nonlabored breathing at rest. Cardiac: Regular rate and  rhythm, no S3 or significant systolic murmur, no pericardial rub. Abdomen: Soft, nontender, no hepatomegaly, bowel sounds present, no guarding or rebound. Extremities: No pitting edema, distal pulses 2+. Skin: Warm and dry. Musculoskeletal: No kyphosis. Neuropsychiatric: Alert and oriented x3, affect grossly appropriate.  Recent Labwork: 09/17/2022: ALT 28     Component Value Date/Time   CHOL 72 09/17/2022 1110   TRIG 89 09/17/2022 1110   HDL 28 (L) 09/17/2022 1110   CHOLHDL 2.6 09/17/2022 1110   VLDL 18 09/17/2022 1110   LDLCALC 26 09/17/2022 1110    Other Studies Reviewed Today: Echocardiogram in 2022 LVEF 50 to 55% No valve abnormalities  Assessment and Plan: Patient is a 77 year old M known to have CAD s/p CABG in 2019 (unclear anatomy, need records from [place]), HTN, DM 2, GERD, orthostatic hypotension presents to the clinic for follow-up visit.  # CAD s/p CABG in 2019 (unclear anatomy, need records from [place]) currently angina free -Continue aspirin 81 mg once daily -Continue atorvastatin 80 mg nightly and Zetia 10 mg once daily -ER precautions for chest pain  # HLD, at goal -Continue atorvastatin 80 mg nightly and Zetia 10 mg once daily  # HTN, controlled -Continue carvedilol 6.25 mg twice daily, chlorthalidone 12.5 mg once daily, hydralazine 50 mg twice daily, Imdur 30 mg once daily and losartan 100 mg once daily.  I offered to cut down the dose of Imdur but patient wants to follow-up with his PCP at the [facility] for any medication changes.  # Type 2 diabetes Plan follow-up with PCP   I have spent a total of 30 minutes with patient reviewing chart, EKGs, labs and examining patient as well as establishing an assessment and plan that was discussed with the patient.  > 50% of time was spent in direct patient care.    Medication Adjustments/Labs and Tests Ordered: Current medicines are reviewed at length with the patient today.  Concerns regarding medicines are outlined above.    Tests Ordered: No orders of the defined types were placed in this encounter.   Medication Changes: No orders of the defined types were placed in this encounter.   Disposition:  Follow up  one year  Signed, Janyiah Silveri Verne Spurr, MD, 03/17/2023 1:55 PM    [facility] at [facility] [address] [place], [place] [address]

## 2023-04-18 ENCOUNTER — Other Ambulatory Visit: Payer: Self-pay | Admitting: Internal Medicine

## 2023-06-28 ENCOUNTER — Other Ambulatory Visit: Payer: Self-pay

## 2023-06-28 ENCOUNTER — Encounter (HOSPITAL_COMMUNITY): Payer: Self-pay | Admitting: Family Medicine

## 2023-06-28 ENCOUNTER — Inpatient Hospital Stay (HOSPITAL_COMMUNITY)
Admission: EM | Admit: 2023-06-28 | Discharge: 2023-06-29 | DRG: 683 | Disposition: A | Discharge status: Home / Self Care | Payer: No Typology Code available for payment source | Attending: Internal Medicine | Admitting: Internal Medicine

## 2023-06-28 ENCOUNTER — Emergency Department (HOSPITAL_COMMUNITY): Payer: No Typology Code available for payment source

## 2023-06-28 DIAGNOSIS — M899 Disorder of bone, unspecified: Secondary | ICD-10-CM | POA: Insufficient documentation

## 2023-06-28 DIAGNOSIS — Z1152 Encounter for screening for COVID-19: Secondary | ICD-10-CM | POA: Diagnosis not present

## 2023-06-28 DIAGNOSIS — R0789 Other chest pain: Secondary | ICD-10-CM | POA: Diagnosis present

## 2023-06-28 DIAGNOSIS — E7800 Pure hypercholesterolemia, unspecified: Secondary | ICD-10-CM | POA: Diagnosis present

## 2023-06-28 DIAGNOSIS — Z951 Presence of aortocoronary bypass graft: Secondary | ICD-10-CM

## 2023-06-28 DIAGNOSIS — I10 Essential (primary) hypertension: Secondary | ICD-10-CM | POA: Diagnosis present

## 2023-06-28 DIAGNOSIS — Z8042 Family history of malignant neoplasm of prostate: Secondary | ICD-10-CM | POA: Diagnosis not present

## 2023-06-28 DIAGNOSIS — L409 Psoriasis, unspecified: Secondary | ICD-10-CM | POA: Diagnosis present

## 2023-06-28 DIAGNOSIS — E876 Hypokalemia: Secondary | ICD-10-CM | POA: Diagnosis not present

## 2023-06-28 DIAGNOSIS — I2510 Atherosclerotic heart disease of native coronary artery without angina pectoris: Secondary | ICD-10-CM | POA: Diagnosis present

## 2023-06-28 DIAGNOSIS — I129 Hypertensive chronic kidney disease with stage 1 through stage 4 chronic kidney disease, or unspecified chronic kidney disease: Secondary | ICD-10-CM | POA: Diagnosis present

## 2023-06-28 DIAGNOSIS — Z8616 Personal history of COVID-19: Secondary | ICD-10-CM

## 2023-06-28 DIAGNOSIS — K219 Gastro-esophageal reflux disease without esophagitis: Secondary | ICD-10-CM | POA: Diagnosis present

## 2023-06-28 DIAGNOSIS — Z833 Family history of diabetes mellitus: Secondary | ICD-10-CM

## 2023-06-28 DIAGNOSIS — N1831 Chronic kidney disease, stage 3a: Secondary | ICD-10-CM | POA: Diagnosis not present

## 2023-06-28 DIAGNOSIS — F4310 Post-traumatic stress disorder, unspecified: Secondary | ICD-10-CM | POA: Diagnosis present

## 2023-06-28 DIAGNOSIS — E872 Acidosis, unspecified: Secondary | ICD-10-CM | POA: Diagnosis present

## 2023-06-28 DIAGNOSIS — N179 Acute kidney failure, unspecified: Secondary | ICD-10-CM | POA: Diagnosis not present

## 2023-06-28 DIAGNOSIS — Z79899 Other long term (current) drug therapy: Secondary | ICD-10-CM

## 2023-06-28 DIAGNOSIS — Z87891 Personal history of nicotine dependence: Secondary | ICD-10-CM | POA: Diagnosis not present

## 2023-06-28 DIAGNOSIS — Z7984 Long term (current) use of oral hypoglycemic drugs: Secondary | ICD-10-CM | POA: Diagnosis not present

## 2023-06-28 DIAGNOSIS — E119 Type 2 diabetes mellitus without complications: Secondary | ICD-10-CM

## 2023-06-28 DIAGNOSIS — R000 Tachycardia, unspecified: Secondary | ICD-10-CM | POA: Diagnosis present

## 2023-06-28 DIAGNOSIS — N189 Chronic kidney disease, unspecified: Secondary | ICD-10-CM | POA: Diagnosis not present

## 2023-06-28 DIAGNOSIS — E860 Dehydration: Secondary | ICD-10-CM | POA: Diagnosis present

## 2023-06-28 DIAGNOSIS — N1832 Chronic kidney disease, stage 3b: Secondary | ICD-10-CM | POA: Diagnosis present

## 2023-06-28 DIAGNOSIS — E1122 Type 2 diabetes mellitus with diabetic chronic kidney disease: Secondary | ICD-10-CM | POA: Diagnosis present

## 2023-06-28 DIAGNOSIS — G4733 Obstructive sleep apnea (adult) (pediatric): Secondary | ICD-10-CM | POA: Insufficient documentation

## 2023-06-28 LAB — CBC
HCT: 45.8 % (ref 39.0–52.0)
Hemoglobin: 15.5 g/dL (ref 13.0–17.0)
MCH: 32.8 pg (ref 26.0–34.0)
MCHC: 33.8 g/dL (ref 30.0–36.0)
MCV: 96.8 fL (ref 80.0–100.0)
Platelets: 174 10*3/uL (ref 150–400)
RBC: 4.73 MIL/uL (ref 4.22–5.81)
RDW: 13.2 % (ref 11.5–15.5)
WBC: 6.4 10*3/uL (ref 4.0–10.5)
nRBC: 0 % (ref 0.0–0.2)

## 2023-06-28 LAB — TROPONIN I (HIGH SENSITIVITY): Troponin I (High Sensitivity): 17 ng/L (ref <18)

## 2023-06-28 LAB — BASIC METABOLIC PANEL
Anion gap: 14 (ref 5–15)
BUN: 20 mg/dL (ref 8–23)
CO2: 22 mmol/L (ref 22–32)
Calcium: 9.5 mg/dL (ref 8.9–10.3)
Chloride: 100 mmol/L (ref 98–111)
Creatinine, Ser: 2.22 mg/dL — ABNORMAL HIGH (ref 0.61–1.24)
GFR, Estimated: 30 mL/min — ABNORMAL LOW (ref >60)
Glucose, Bld: 239 mg/dL — ABNORMAL HIGH (ref 70–99)
Potassium: 3.3 mmol/L — ABNORMAL LOW (ref 3.5–5.1)
Sodium: 136 mmol/L (ref 135–145)

## 2023-06-28 LAB — SARS CORONAVIRUS 2 BY RT PCR: SARS Coronavirus 2 by RT PCR: POSITIVE — AB

## 2023-06-28 LAB — BRAIN NATRIURETIC PEPTIDE: B Natriuretic Peptide: 47 pg/mL (ref 0.0–100.0)

## 2023-06-28 LAB — LACTIC ACID, PLASMA: Lactic Acid, Venous: 3.2 mmol/L (ref 0.5–1.9)

## 2023-06-28 MED ORDER — TAMSULOSIN HCL 0.4 MG PO CAPS
0.4000 mg | ORAL_CAPSULE | Freq: Every day | ORAL | Status: DC
  Administered 2023-06-29: 0.4 mg via ORAL
  Filled 2023-06-28: qty 1

## 2023-06-28 MED ORDER — CARVEDILOL 3.125 MG PO TABS
6.2500 mg | ORAL_TABLET | Freq: Two times a day (BID) | ORAL | Status: DC
  Administered 2023-06-29: 6.25 mg via ORAL
  Filled 2023-06-28: qty 2

## 2023-06-28 MED ORDER — LACTATED RINGERS IV SOLN: INTRAVENOUS | Status: AC

## 2023-06-28 MED ORDER — SODIUM CHLORIDE 0.9% FLUSH
3.0000 mL | Freq: Two times a day (BID) | INTRAVENOUS | Status: DC
  Administered 2023-06-29: 3 mL via INTRAVENOUS

## 2023-06-28 MED ORDER — ONDANSETRON HCL 4 MG/2ML IJ SOLN: 4.0000 mg | Freq: Four times a day (QID) | INTRAMUSCULAR | Status: DC | PRN

## 2023-06-28 MED ORDER — SODIUM CHLORIDE 0.9 % IV BOLUS
1000.0000 mL | Freq: Once | INTRAVENOUS | Status: AC
  Administered 2023-06-28: 1000 mL via INTRAVENOUS

## 2023-06-28 MED ORDER — ACETAMINOPHEN 325 MG PO TABS: 650.0000 mg | ORAL_TABLET | Freq: Four times a day (QID) | ORAL | Status: DC | PRN

## 2023-06-28 MED ORDER — INSULIN ASPART 100 UNIT/ML IJ SOLN: 0.0000 [IU] | Freq: Every day | INTRAMUSCULAR | Status: DC

## 2023-06-28 MED ORDER — ONDANSETRON HCL 4 MG PO TABS: 4.0000 mg | ORAL_TABLET | Freq: Four times a day (QID) | ORAL | Status: DC | PRN

## 2023-06-28 MED ORDER — ACETAMINOPHEN 650 MG RE SUPP: 650.0000 mg | Freq: Four times a day (QID) | RECTAL | Status: DC | PRN

## 2023-06-28 MED ORDER — INSULIN ASPART 100 UNIT/ML IJ SOLN: 0.0000 [IU] | Freq: Three times a day (TID) | INTRAMUSCULAR | Status: DC

## 2023-06-28 MED ORDER — HEPARIN SODIUM (PORCINE) 5000 UNIT/ML IJ SOLN: 5000.0000 [IU] | Freq: Three times a day (TID) | INTRAMUSCULAR | Status: DC

## 2023-06-28 MED ORDER — PANTOPRAZOLE SODIUM 40 MG PO TBEC
40.0000 mg | DELAYED_RELEASE_TABLET | Freq: Every day | ORAL | Status: DC
  Administered 2023-06-29: 40 mg via ORAL
  Filled 2023-06-28: qty 1

## 2023-06-28 MED ORDER — ATORVASTATIN CALCIUM 40 MG PO TABS
80.0000 mg | ORAL_TABLET | Freq: Every day | ORAL | Status: DC
  Administered 2023-06-29: 80 mg via ORAL
  Filled 2023-06-28: qty 2

## 2023-06-28 MED ORDER — POTASSIUM CHLORIDE CRYS ER 20 MEQ PO TBCR
20.0000 meq | EXTENDED_RELEASE_TABLET | Freq: Once | ORAL | Status: AC
  Administered 2023-06-29: 20 meq via ORAL
  Filled 2023-06-28: qty 1

## 2023-06-28 MED ORDER — ALBUTEROL SULFATE (2.5 MG/3ML) 0.083% IN NEBU: 2.5000 mg | INHALATION_SOLUTION | RESPIRATORY_TRACT | Status: DC | PRN

## 2023-06-28 NOTE — ED Provider Notes (Addendum)
[place] EMERGENCY DEPARTMENT AT [facility] Provider Note   CSN: [id number] Arrival date & time: 06/28/23  2127     History  Chief Complaint  Patient presents with   Chest Pain   Shortness of Breath    Chad Perry is a 77 y.o. male.   Chest Pain Associated symptoms: shortness of breath   Shortness of Breath Associated symptoms: chest pain    77 year old male, complaint of chest pain and shortness of breath, has a history of hypertension, diabetes, prior triple bypass cardiac in 2019, not anticoagulated.  Reports that for the last 2 days he has felt some degree of shortness of breath and chest discomfort, today it seemed to get worse while he was weed eating in the yard.  He became very diaphoretic and weak and near syncopal.  He has not had fevers but does note that about a week and a half ago he was diagnosed with COVID-19 and states he just came out of quarantine 2 days ago.  There has been no nausea vomiting or diarrhea.    Home Medications Prior to Admission medications   Medication Sig Start Date End Date Taking? Authorizing Provider  albuterol (VENTOLIN HFA) 108 (90 Base) MCG/ACT inhaler Inhale 2 puffs into the lungs every 4 (four) hours as needed for shortness of breath. 04/07/21   [provider]  atorvastatin (LIPITOR) 80 MG tablet Take 1 tablet by mouth at bedtime. 07/09/22   [provider]  carvedilol (COREG) 6.25 MG tablet Take 1 tablet (6.25 mg total) by mouth 2 (two) times daily. 09/24/21 03/17/23  Darlin Drop, DO  cetirizine (ZYRTEC) 10 MG tablet Take 10 mg by mouth daily as needed. 08/06/22   [provider]  chlorthalidone (HYGROTON) 25 MG tablet Take 12.5 mg by mouth daily.    [provider]  Cholecalciferol 50 MCG (2000 UT) TABS Take 1 tablet by mouth daily. 01/24/23   [provider]  ezetimibe (ZETIA) 10 MG tablet Take 1 tablet by mouth once daily 04/18/23   Chandrasekhar, Mahesh A, MD  ferrous sulfate 325  (65 FE) MG tablet Take 325 mg by mouth every other day.    [provider]  fluticasone-salmeterol (WIXELA INHUB) 100-50 MCG/ACT AEPB Inhale 1 puff into the lungs 2 (two) times daily.    [provider]  Garlic 1000 MG CAPS Take 1 capsule by mouth daily.    [provider]  hydrALAZINE (APRESOLINE) 100 MG tablet Take 1 tablet (100 mg total) by mouth 3 (three) times daily. 09/24/21 03/17/23  Darlin Drop, DO  isosorbide mononitrate (IMDUR) 30 MG 24 hr tablet Take 1 tablet (30 mg total) by mouth daily. 11/22/22 11/17/23  Christell Constant, MD  losartan (COZAAR) 100 MG tablet Take 100 mg by mouth daily.    [provider]  metFORMIN (GLUCOPHAGE) 500 MG tablet Take 500 mg by mouth 2 (two) times daily with a meal.    [provider]  omeprazole (PRILOSEC) 20 MG capsule Take 20 mg by mouth daily.    [provider]  sertraline (ZOLOFT) 50 MG tablet TAKE ONE TABLET BY MOUTH EVERY DAY FOR PTSD, DEPRESSION 02/05/23   [provider]  tamsulosin (FLOMAX) 0.4 MG CAPS capsule Take 0.4 mg by mouth daily.    [provider]      Allergies    Patient has no known allergies.    Review of Systems   Review of Systems  Respiratory:  Positive for  shortness of breath.   Cardiovascular:  Positive for chest pain.  All other systems reviewed and are negative.   Physical Exam Updated Vital Signs BP 100/69   Pulse 84   Temp 98.6 F (37 C) (Oral)   Resp (!) 25   SpO2 96%  Physical Exam Vitals and nursing note reviewed.  Constitutional:      General: He is not in acute distress.    Appearance: He is well-developed.  HENT:     Head: Normocephalic and atraumatic.     Mouth/Throat:     Pharynx: No oropharyngeal exudate.  Eyes:     General: No scleral icterus.       Right eye: No discharge.        Left eye: No discharge.     Conjunctiva/sclera: Conjunctivae normal.     Pupils: Pupils are equal, round, and reactive to light.   Neck:     Thyroid: No thyromegaly.     Vascular: No JVD.  Cardiovascular:     Rate and Rhythm: Regular rhythm. Tachycardia present.     Heart sounds: Normal heart sounds. No murmur heard.    No friction rub. No gallop.     Comments: Weak but palpable peripheral pulses Pulmonary:     Effort: Pulmonary effort is normal. No respiratory distress.     Breath sounds: Normal breath sounds. No wheezing or rales.  Abdominal:     General: Bowel sounds are normal. There is no distension.     Palpations: Abdomen is soft. There is no mass.     Tenderness: There is no abdominal tenderness.  Musculoskeletal:        General: No tenderness. Normal range of motion.     Cervical back: Normal range of motion and neck supple.     Right lower leg: No edema.     Left lower leg: No edema.  Lymphadenopathy:     Cervical: No cervical adenopathy.  Skin:    General: Skin is warm and dry.     Findings: No erythema or rash.  Neurological:     Mental Status: He is alert.     Coordination: Coordination normal.  Psychiatric:        Behavior: Behavior normal.     ED Results / Procedures / Treatments   Labs (all labs ordered are listed, but only abnormal results are displayed) Labs Reviewed  SARS CORONAVIRUS 2 BY RT PCR - Abnormal; Notable for the following components:      Result Value   SARS Coronavirus 2 by RT PCR POSITIVE (*)    All other components within normal limits  BASIC METABOLIC PANEL - Abnormal; Notable for the following components:   Potassium 3.3 (*)    Glucose, Bld 239 (*)    Creatinine, Ser 2.22 (*)    GFR, Estimated 30 (*)    All other components within normal limits  LACTIC ACID, PLASMA - Abnormal; Notable for the following components:   Lactic Acid, Venous 3.2 (*)    All other components within normal limits  CBC  BRAIN NATRIURETIC PEPTIDE  LACTIC ACID, PLASMA  TROPONIN I (HIGH SENSITIVITY)  TROPONIN I (HIGH SENSITIVITY)    EKG EKG Interpretation Date/Time:  Saturday  June 28 2023 22:23:32 EDT Ventricular Rate:  78 PR Interval:  221 QRS Duration:  116 QT Interval:  405 QTC Calculation: 462 R Axis:   73  Text Interpretation: Sinus rhythm Atrial premature complexes Prolonged PR interval Nonspecific intraventricular conduction delay Nonspecific T abnrm, anterolateral leads  Confirmed by Eber Hong ([id number]) on 06/28/2023 11:06:10 PM  Radiology DG Chest 2 View  Result Date: 06/28/2023 CLINICAL DATA:  Chest pain EXAM: CHEST - 2 VIEW COMPARISON:  09/23/2021 FINDINGS: Cardiac shadow is within normal limits. Postsurgical changes are again seen. Lungs are well aerated bilaterally. No focal infiltrate or effusion is seen. No bony abnormality is noted. IMPRESSION: No active cardiopulmonary disease. Electronically Signed   By: Alcide Clever M.D.   On: 06/28/2023 22:25    Procedures .Critical Care  Performed by: Eber Hong, MD Authorized by: Eber Hong, MD   Critical care provider statement:    Critical care time (minutes):  30   Critical care time was exclusive of:  Separately billable procedures and treating other patients and teaching time   Critical care was necessary to treat or prevent imminent or life-threatening deterioration of the following conditions:  Renal failure and cardiac failure   Critical care was time spent personally by me on the following activities:  Development of treatment plan with patient or surrogate, discussions with consultants, evaluation of patient's response to treatment, examination of patient, ordering and review of laboratory studies, ordering and review of radiographic studies, ordering and performing treatments and interventions, pulse oximetry, re-evaluation of patient's condition, review of old charts and obtaining history from patient or surrogate   I assumed direction of critical care for this patient from another provider in my specialty: no     Care discussed with: admitting provider   Comments:            Medications Ordered in ED Medications  sodium chloride 0.9 % bolus 1,000 mL (1,000 mLs Intravenous New Bag/Given 06/28/23 2202)    ED Course/ Medical Decision Making/ A&P                                 Medical Decision Making Amount and/or Complexity of Data Reviewed Labs: ordered. Radiology: ordered.  Risk Decision regarding hospitalization.    This patient presents to the ED for concern of shortness of breath and chest pain, this involves an extensive number of treatment options, and is a complaint that carries with it a high risk of complications and morbidity.  The differential diagnosis includes coronary disease, congestive heart failure, myocarditis, pneumonia, pneumothorax, pulmonary embolism, primary arrhythmia such as atrial flutter or atrial fibrillation.   Co morbidities that complicate the patient evaluation  Known coronary disease   Additional history obtained:  Additional history obtained from medical record External records from outside source obtained and reviewed including prior hospitalization and surgery    Lab Tests:  I Ordered, and personally interpreted labs.  The pertinent results include: Potassium of 3.3, creatinine of 2.2 up from 1.3 baseline, CBC normal, troponin 17, BNP 47, lactate 3.2.  I do not think that the elevated lactic acid is related to underlying infection, this is far more likely related to the tachyarrhythmia and perfusion issue.  He was initially hypotensive, responded to fluids, no leukocytosis or fever to suggest underlying infection or sepsis   Imaging Studies ordered:  I ordered imaging studies including chest x-ray I independently visualized and interpreted imaging which showed no acute findings I agree with the radiologist interpretation   Cardiac Monitoring: / EKG:  The patient was maintained on a cardiac monitor.  I personally viewed and interpreted the cardiac monitored which showed an underlying rhythm of:  Initially narrow complex tachyarrhythmia, on recheck had resolved into a normal  sinus rhythm after IV fluids   Consultations Obtained:  I requested consultation with the hospitalist,  and discussed lab and imaging findings as well as pertinent plan - they recommend: Patient   Problem List / ED Course / Critical interventions / Medication management  IV fluids, improved significantly, tacky arrhythmia resolved, repeat EKG shows back to sinus rhythm AKI - treated with IVF Arrhythmia -likely the cause of the hypotension and the elevated lactic acid I ordered medication including normal saline for hypotension Reevaluation of the patient after these medicines showed that the patient resolved I have reviewed the patients home medicines and have made adjustments as needed   Social Determinants of Health:  Prior coronary disease   Test / Admission - Considered:  Admit to hospital, appreciate Dr. Antionette Char for taking care of this patient      Final Clinical Impression(s) / ED Diagnoses Final diagnoses:  AKI (acute kidney injury) (HCC)  Crist Fat, MD 06/28/23 2310    Eber Hong, MD 06/28/23 2333

## 2023-06-28 NOTE — ED Notes (Signed)
Lactic Acid 3.2

## 2023-06-28 NOTE — H&P (Signed)
History and Physical    Claire Tolhurst JXB:[id number] DOB: June 04, 1946 DOA: 06/28/2023  PCP: [facility]   Patient coming from: Home   Chief Complaint: Fatigue, SOB, chest discomfort   HPI: Chad Perry is a pleasant 77 y.o. male with medical history significant for hypertension, hyperlipidemia, type 2 diabetes mellitus, CAD status post CABG in 2019, and recent COVID-19 infection who presents to the emergency department with fatigue, shortness of breath, and chest discomfort.  Patient's wife was sick with COVID-19 recently, patient himself had some mild symptoms, tested positive on 06/18/2023, and his symptoms have since resolved.  He went on to have episodes of mild chest discomfort and dyspnea over the past 2 days and then had a more severe episode that occurred today while he was doing some yard work.  Episode was marked by diaphoresis, nausea, shortness of breath, and chest discomfort.  He denies leg swelling, fever, or cough.  ED Course: Upon arrival to the ED, patient is found to be afebrile and saturating mid 90s on room air with elevated heart rate and systolic blood pressure of 92 and greater.  EKG demonstrates a narrow complex regular tachyarrhythmia and chest x-ray is negative for acute findings.  Labs are most notable for creatinine 2.22, lactic acid 3.2, normal troponin, and normal BNP.  Patient was given 1 L of normal saline in the ED.  He converted to sinus rhythm and reports complete resolution of his symptoms.  Review of Systems:  All other systems reviewed and apart from HPI, are negative.  Past Medical History:  Diagnosis Date   Back pain    CAD (coronary artery disease)    Diabetes mellitus without complication (HCC)    type 2   GERD (gastroesophageal reflux disease)    High cholesterol    Hypertension    Osteoarthritis    knee   Psoriasis    PTSD (post-traumatic stress disorder)     Past Surgical History:  Procedure Laterality Date   CARDIAC SURGERY   11/2016   triple bypass   ELBOW SURGERY     FEMUR FRACTURE SURGERY Right 1970   KNEE SURGERY Right 02/17/2018   replacement   WRIST SURGERY      Social History:   reports that he has quit smoking. He has never used smokeless tobacco. He reports that he does not drink alcohol and does not use drugs.  No Known Allergies  Family History  Problem Relation Age of Onset   Prostate cancer Father    Diabetes Sister      Prior to Admission medications   Medication Sig Start Date End Date Taking? Authorizing Provider  albuterol (VENTOLIN HFA) 108 (90 Base) MCG/ACT inhaler Inhale 2 puffs into the lungs every 4 (four) hours as needed for shortness of breath. 04/07/21   [provider]  atorvastatin (LIPITOR) 80 MG tablet Take 1 tablet by mouth at bedtime. 07/09/22   [provider]  carvedilol (COREG) 6.25 MG tablet Take 1 tablet (6.25 mg total) by mouth 2 (two) times daily. 09/24/21 03/17/23  Darlin Drop, DO  cetirizine (ZYRTEC) 10 MG tablet Take 10 mg by mouth daily as needed. 08/06/22   [provider]  chlorthalidone (HYGROTON) 25 MG tablet Take 12.5 mg by mouth daily.    [provider]  Cholecalciferol 50 MCG (2000 UT) TABS Take 1 tablet by mouth daily. 01/24/23   [provider]  ezetimibe (ZETIA) 10 MG tablet Take 1 tablet by mouth once daily 04/18/23  Chandrasekhar, Mahesh A, MD  ferrous sulfate 325 (65 FE) MG tablet Take 325 mg by mouth every other day.    [provider]  fluticasone-salmeterol (WIXELA INHUB) 100-50 MCG/ACT AEPB Inhale 1 puff into the lungs 2 (two) times daily.    [provider]  Garlic 1000 MG CAPS Take 1 capsule by mouth daily.    [provider]  hydrALAZINE (APRESOLINE) 100 MG tablet Take 1 tablet (100 mg total) by mouth 3 (three) times daily. 09/24/21 03/17/23  Darlin Drop, DO  isosorbide mononitrate (IMDUR) 30 MG 24 hr tablet Take 1 tablet (30 mg total) by mouth daily. 11/22/22 11/17/23   Christell Constant, MD  losartan (COZAAR) 100 MG tablet Take 100 mg by mouth daily.    [provider]  metFORMIN (GLUCOPHAGE) 500 MG tablet Take 500 mg by mouth 2 (two) times daily with a meal.    [provider]  omeprazole (PRILOSEC) 20 MG capsule Take 20 mg by mouth daily.    [provider]  sertraline (ZOLOFT) 50 MG tablet TAKE ONE TABLET BY MOUTH EVERY DAY FOR PTSD, DEPRESSION 02/05/23   [provider]  tamsulosin (FLOMAX) 0.4 MG CAPS capsule Take 0.4 mg by mouth daily.    [provider]    Physical Exam: Vitals:   06/28/23 2200 06/28/23 2203 06/28/23 2350 06/28/23 2353  BP: 100/69  118/73   Pulse: (!) 130 84 86   Resp: 19 (!) 25 20   Temp:   98.6 F (37 C)   TempSrc:   Oral   SpO2: 94% 96% 95% 95%    Constitutional: NAD, calm  Eyes: PERTLA, lids and conjunctivae normal ENMT: Mucous membranes are moist. Posterior pharynx clear of any exudate or lesions.   Neck: supple, no masses  Respiratory: no wheezing, no crackles. No accessory muscle use.  Cardiovascular: S1 & S2 heard, regular rate and rhythm. No extremity edema.   Abdomen: No distension, no tenderness, soft. Bowel sounds active.  Musculoskeletal: no clubbing / cyanosis. No joint deformity upper and lower extremities.   Skin: no significant rashes, lesions, ulcers. Warm, dry, well-perfused. Neurologic: CN 2-12 grossly intact. Moving all extremities. Alert and oriented.  Psychiatric: Pleasant. Cooperative.    Labs and Imaging on Admission: I have personally reviewed following labs and imaging studies  CBC: Recent Labs  Lab 06/28/23 2159  WBC 6.4  HGB 15.5  HCT 45.8  MCV 96.8  PLT 174   Basic Metabolic Panel: Recent Labs  Lab 06/28/23 2159  NA 136  K 3.3*  CL 100  CO2 22  GLUCOSE 239*  BUN 20  CREATININE 2.22*  CALCIUM 9.5   GFR: CrCl cannot be calculated (Unknown ideal weight.). Liver Function Tests: No results for input(s): "AST", "ALT",  "ALKPHOS", "BILITOT", "PROT", "ALBUMIN" in the last 168 hours. No results for input(s): "LIPASE", "AMYLASE" in the last 168 hours. No results for input(s): "AMMONIA" in the last 168 hours. Coagulation Profile: No results for input(s): "INR", "PROTIME" in the last 168 hours. Cardiac Enzymes: No results for input(s): "CKTOTAL", "CKMB", "CKMBINDEX", "TROPONINI" in the last 168 hours. BNP (last 3 results) No results for input(s): "PROBNP" in the last 8760 hours. HbA1C: No results for input(s): "HGBA1C" in the last 72 hours. CBG: No results for input(s): "GLUCAP" in the last 168 hours. Lipid Profile: No results for input(s): "CHOL", "HDL", "LDLCALC", "TRIG", "CHOLHDL", "LDLDIRECT" in the last 72 hours. Thyroid Function Tests: No results for input(s): "TSH", "T4TOTAL", "FREET4", "T3FREE", "THYROIDAB" in the  last 72 hours. Anemia Panel: No results for input(s): "VITAMINB12", "FOLATE", "FERRITIN", "TIBC", "IRON", "RETICCTPCT" in the last 72 hours. Urine analysis: No results found for: "COLORURINE", "APPEARANCEUR", "LABSPEC", "PHURINE", "GLUCOSEU", "HGBUR", "BILIRUBINUR", "KETONESUR", "PROTEINUR", "UROBILINOGEN", "NITRITE", "LEUKOCYTESUR" Sepsis Labs: @LABRCNTIP (procalcitonin:4,lacticidven:4) ) Recent Results (from the past 240 hour(s))  SARS Coronavirus 2 by RT PCR (hospital order, performed in [facility] hospital lab) *cepheid single result test* Anterior Nasal Swab     Status: Abnormal   Collection Time: 06/28/23 10:22 PM   Specimen: Anterior Nasal Swab  Result Value Ref Range Status   SARS Coronavirus 2 by RT PCR POSITIVE (A) NEGATIVE Final    Comment: (NOTE) SARS-CoV-2 target nucleic acids are DETECTED  SARS-CoV-2 RNA is generally detectable in upper respiratory specimens  during the acute phase of infection.  Positive results are indicative  of the presence of the identified virus, but do not rule out bacterial infection or co-infection with other pathogens not detected by the  test.  Clinical correlation with patient history and  other diagnostic information is necessary to determine patient infection status.  The expected result is negative.  Fact Sheet for Patients:   RoadLapTop.co.za   Fact Sheet for Healthcare Providers:   http://kim-miller.com/    This test is not yet approved or cleared by the [place] FDA and  has been authorized for detection and/or diagnosis of SARS-CoV-2 by FDA under an Emergency Use Authorization (EUA).  This EUA will remain in effect (meaning this test can be used) for the duration of  the COVID-19 declaration under Section 564(b)(1)  of the Act, 21 U.S.C. section 360-bbb-3(b)(1), unless the authorization is terminated or revoked sooner.   Performed at [facility], [address] [place], [place] [address]      Radiological Exams on Admission: DG Chest 2 View  Result Date: 06/28/2023 CLINICAL DATA:  Chest pain EXAM: CHEST - 2 VIEW COMPARISON:  09/23/2021 FINDINGS: Cardiac shadow is within normal limits. Postsurgical changes are again seen. Lungs are well aerated bilaterally. No focal infiltrate or effusion is seen. No bony abnormality is noted. IMPRESSION: No active cardiopulmonary disease. Electronically Signed   By: Alcide Clever M.D.   On: 06/28/2023 22:25    EKG: Independently reviewed. Sinus tachycardia vs atrial flutter, rate 148.   Assessment/Plan   1. 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 Tachyarrhythmia  - Presents with SOB, chest discomfort, and diaphoresis and found to have HR ~140 with narrow complex regular tachycardia on EKG  - He went back to SR with normal rate in ED and all of his symptoms resolved at that time  - Continue  cardiac monitoring, optimize electrolytes, check TSH, continue to rehydrate with IVF    3. CAD  - Recent atypical chest pain likely related to tachyarrhythmia   - Continue ASA 81, Lipitor, and Coreg    4. Hypertension  - BP is low-normal in ED and chlorthalidone, hydralazine, and losartan held on admission    5. Type II DM  - A1c was 6.3% in 2022  - Hold metformin, check CBGs, use low-intensity SSI for now    6. Hypokalemia  - Replace potassium    7. Recent COVID infection  - First diagnosed on 7/31, had very mild  symptoms, and has recovered  - He no longer requires isolation     DVT prophylaxis: sq heparin  Code Status: Full  Level of Care: Level of care: Telemetry Family Communication: None present   Disposition Plan:  Patient is from: Home  Anticipated d/c is to: TBD Anticipated d/c date is: 07/01/23  Patient currently: Pending cardiac monitoring, improved renal function  Consults called: None  Admission status: Inpatient     Briscoe Deutscher, MD [facility]  06/29/2023, 12:25 AM

## 2023-06-28 NOTE — ED Triage Notes (Addendum)
Pt complains of chest pain and SOB after doing yard work. No n/v/d. Pt complains of chest and throat tightness. Pt had COVID last week.

## 2023-06-29 DIAGNOSIS — N189 Chronic kidney disease, unspecified: Secondary | ICD-10-CM | POA: Diagnosis not present

## 2023-06-29 DIAGNOSIS — R000 Tachycardia, unspecified: Secondary | ICD-10-CM | POA: Diagnosis not present

## 2023-06-29 DIAGNOSIS — N179 Acute kidney failure, unspecified: Secondary | ICD-10-CM

## 2023-06-29 LAB — TROPONIN I (HIGH SENSITIVITY): Troponin I (High Sensitivity): 15 ng/L (ref <18)

## 2023-06-29 LAB — BASIC METABOLIC PANEL WITH GFR
Anion gap: 6 (ref 5–15)
BUN: 17 mg/dL (ref 8–23)
CO2: 24 mmol/L (ref 22–32)
Calcium: 8.7 mg/dL — ABNORMAL LOW (ref 8.9–10.3)
Chloride: 105 mmol/L (ref 98–111)
Creatinine, Ser: 1.64 mg/dL — ABNORMAL HIGH (ref 0.61–1.24)
GFR, Estimated: 43 mL/min — ABNORMAL LOW (ref >60)
Glucose, Bld: 147 mg/dL — ABNORMAL HIGH (ref 70–99)
Potassium: 3.4 mmol/L — ABNORMAL LOW (ref 3.5–5.1)
Sodium: 135 mmol/L (ref 135–145)

## 2023-06-29 LAB — CBC
HCT: 39.1 % (ref 39.0–52.0)
Hemoglobin: 12.9 g/dL — ABNORMAL LOW (ref 13.0–17.0)
MCH: 32.8 pg (ref 26.0–34.0)
MCHC: 33 g/dL (ref 30.0–36.0)
MCV: 99.5 fL (ref 80.0–100.0)
Platelets: 141 10*3/uL — ABNORMAL LOW (ref 150–400)
RBC: 3.93 MIL/uL — ABNORMAL LOW (ref 4.22–5.81)
RDW: 13.4 % (ref 11.5–15.5)
WBC: 5.6 10*3/uL (ref 4.0–10.5)
nRBC: 0 % (ref 0.0–0.2)

## 2023-06-29 LAB — MAGNESIUM: Magnesium: 1.7 mg/dL (ref 1.7–2.4)

## 2023-06-29 LAB — GLUCOSE, CAPILLARY
Glucose-Capillary: 171 mg/dL — ABNORMAL HIGH (ref 70–99)
Glucose-Capillary: 113 mg/dL — ABNORMAL HIGH (ref 70–99)
Glucose-Capillary: 184 mg/dL — ABNORMAL HIGH (ref 70–99)

## 2023-06-29 LAB — LACTIC ACID, PLASMA
Lactic Acid, Venous: 2 mmol/L (ref 0.5–1.9)
Lactic Acid, Venous: 3.3 mmol/L (ref 0.5–1.9)

## 2023-06-29 LAB — CBG MONITORING, ED: Glucose-Capillary: 154 mg/dL — ABNORMAL HIGH (ref 70–99)

## 2023-06-29 LAB — TSH: TSH: 2.286 u[IU]/mL (ref 0.350–4.500)

## 2023-06-29 MED ORDER — SODIUM CHLORIDE 0.9 % IV BOLUS
1000.0000 mL | Freq: Once | INTRAVENOUS | Status: AC
  Administered 2023-06-29: 1000 mL via INTRAVENOUS

## 2023-06-29 MED ORDER — ASPIRIN 81 MG PO TBEC
81.0000 mg | DELAYED_RELEASE_TABLET | Freq: Every day | ORAL | Status: DC
  Administered 2023-06-29: 81 mg via ORAL
  Filled 2023-06-29: qty 1

## 2023-06-29 MED ORDER — POTASSIUM CHLORIDE CRYS ER 20 MEQ PO TBCR
20.0000 meq | EXTENDED_RELEASE_TABLET | Freq: Once | ORAL | Status: AC
  Administered 2023-06-29: 20 meq via ORAL
  Filled 2023-06-29: qty 1

## 2023-06-29 NOTE — Progress Notes (Signed)
Pt admitted to rm 327 A & O x 4, ambulated in room to bed. Skin assessment small 1 cm round healing wound to right thigh. Pt states he is getting wound care for it outpatient.

## 2023-06-29 NOTE — Discharge Summary (Signed)
Physician Discharge Summary  Chad Perry MVH:[id number] DOB: Jun 09, 1946 DOA: 06/28/2023  PCP: [facility]  Admit date: 06/28/2023 Discharge date: 06/29/2023  Admitted From: Home Disposition: Home  Recommendations for Outpatient Follow-up:  Follow up with PCP in 1-2 weeks Please obtain BMP/CBC in one week Please follow up on the following pending results:  Discharge Condition: Stable CODE STATUS: Full code Diet recommendation: Low-salt diet, plenty of hydration  Discharge summary: 77 year old with history of hypertension, hyperlipidemia type 2 diabetes, coronary artery disease status post CABG in 2019, recent COVID-19 infection about 10 days ago presented to the emergency room with fatigue, shortness of breath and chest discomfort after working all day outside in his yard.  Upon arrival to the emergency room, he was afebrile.  On room air.  Blood pressure 92 and greater.  EKG with narrow complex regular tachyarrhythmia.  Chest x-ray negative.  Initially heart rate was 138.  Creatinine 2.22 with known baseline of 1.3-1.4.  Lactic acid 3.2.  Troponins normal.  BNP was normal.  Chest pain, atypical.  Acute coronary syndrome ruled out.  Currently asymptomatic.  Moderate dehydration with AKI on CKD 3B: Probably working outside, dehydration and also with concomitant use of blood pressure medications and metformin.  Overnight hydrated.  Symptoms improved.  Able to take oral hydration. -Plenty of fluid by mouth. -Resume antihypertensives including beta-blockers, however continue to hold losartan, chlorthalidone and metformin for next 24 hours and can resume on 8/13.  Recheck blood test including renal function test in 1 week.  Hypokalemia: Persistent.  Replaced before discharge.  Recent COVID-19 infection: Diagnosed 7/31.  Out of isolation window.  No indication to continue to check.  Asymptomatic.  Stable for discharge.   Discharge Diagnoses:  Principal Problem:   Acute renal  failure superimposed on stage 3a chronic kidney disease (HCC) Active Problems:   Type 2 diabetes mellitus (HCC)   CAD (coronary artery disease)   Benign essential hypertension   Tachyarrhythmia   Hypokalemia    Discharge Instructions  Discharge Instructions     Diet - low sodium heart healthy   Complete by: As directed    Diet Carb Modified   Complete by: As directed    Discharge instructions   Complete by: As directed    Please hold Losartan, Chlorthalidone and Metformin until 8/13 and start taking it   Increase activity slowly   Complete by: As directed       Allergies as of 06/29/2023   No Known Allergies      Medication List     TAKE these medications    albuterol 108 (90 Base) MCG/ACT inhaler Commonly known as: VENTOLIN HFA Inhale 2 puffs into the lungs every 4 (four) hours as needed for shortness of breath.   atorvastatin 80 MG tablet Commonly known as: LIPITOR Take 1 tablet by mouth at bedtime.   carvedilol 6.25 MG tablet Commonly known as: COREG Take 1 tablet (6.25 mg total) by mouth 2 (two) times daily.   cetirizine 10 MG tablet Commonly known as: ZYRTEC Take 10 mg by mouth daily as needed.   chlorthalidone 25 MG tablet Commonly known as: HYGROTON Take 12.5 mg by mouth daily.   Cholecalciferol 50 MCG (2000 UT) Tabs Take 1 tablet by mouth daily.   ezetimibe 10 MG tablet Commonly known as: ZETIA Take 1 tablet by mouth once daily   ferrous sulfate 325 (65 FE) MG tablet Take 325 mg by mouth every other day.   Garlic 1000 MG Caps Take 1  capsule by mouth daily.   hydrALAZINE 100 MG tablet Commonly known as: APRESOLINE Take 1 tablet (100 mg total) by mouth 3 (three) times daily.   isosorbide mononitrate 30 MG 24 hr tablet Commonly known as: IMDUR Take 1 tablet (30 mg total) by mouth daily.   losartan 100 MG tablet Commonly known as: COZAAR Take 100 mg by mouth daily.   metFORMIN 500 MG tablet Commonly known as: GLUCOPHAGE Take 500 mg  by mouth 2 (two) times daily with a meal.   omeprazole 20 MG capsule Commonly known as: PRILOSEC Take 20 mg by mouth daily.   sertraline 50 MG tablet Commonly known as: ZOLOFT TAKE ONE TABLET BY MOUTH EVERY DAY FOR PTSD, DEPRESSION   tamsulosin 0.4 MG Caps capsule Commonly known as: FLOMAX Take 0.4 mg by mouth daily.   Wixela Inhub 100-50 MCG/ACT Aepb Generic drug: fluticasone-salmeterol Inhale 1 puff into the lungs 2 (two) times daily.        No Known Allergies  Consultations: None   Procedures/Studies: DG Chest 2 View  Result Date: 06/28/2023 CLINICAL DATA:  Chest pain EXAM: CHEST - 2 VIEW COMPARISON:  09/23/2021 FINDINGS: Cardiac shadow is within normal limits. Postsurgical changes are again seen. Lungs are well aerated bilaterally. No focal infiltrate or effusion is seen. No bony abnormality is noted. IMPRESSION: No active cardiopulmonary disease. Electronically Signed   By: Alcide Clever M.D.   On: 06/28/2023 22:25   (Echo, Carotid, EGD, Colonoscopy, ERCP)    Subjective: Patient seen at morning rounds.  Denies any complaints.  Denies any nausea vomiting or chest tightness.  Heart rate has remained sinus and without any arrhythmias.  Denies any palpitations.  Eager to go home.   Discharge Exam: Vitals:   06/29/23 0241 06/29/23 0645  BP: 134/85 135/84  Pulse: 78 64  Resp:    Temp: 98.1 F (36.7 C) 97.9 F (36.6 C)  SpO2: 98% 97%   Vitals:   06/29/23 0130 06/29/23 0223 06/29/23 0241 06/29/23 0645  BP: 118/77  134/85 135/84  Pulse: 72  78 64  Resp: (!) 21     Temp:   98.1 F (36.7 C) 97.9 F (36.6 C)  TempSrc:   Oral Oral  SpO2: 96%  98% 97%  Weight:  97.3 kg    Height:  5\' 6"  (1.676 m)      General: Pt is alert, awake, not in acute distress Cardiovascular: RRR, S1/S2 +, no rubs, no gallops Respiratory: CTA bilaterally, no wheezing, no rhonchi Abdominal: Soft, NT, ND, bowel sounds + Extremities: no edema, no cyanosis    The results of significant  diagnostics from this hospitalization (including imaging, microbiology, ancillary and laboratory) are listed below for reference.     Microbiology: Recent Results (from the past 240 hour(s))  SARS Coronavirus 2 by RT PCR (hospital order, performed in [facility] hospital lab) *cepheid single result test* Anterior Nasal Swab     Status: Abnormal   Collection Time: 06/28/23 10:22 PM   Specimen: Anterior Nasal Swab  Result Value Ref Range Status   SARS Coronavirus 2 by RT PCR POSITIVE (A) NEGATIVE Final    Comment: (NOTE) SARS-CoV-2 target nucleic acids are DETECTED  SARS-CoV-2 RNA is generally detectable in upper respiratory specimens  during the acute phase of infection.  Positive results are indicative  of the presence of the identified virus, but do not rule out bacterial infection or co-infection with other pathogens not detected by the test.  Clinical correlation with patient history and  other diagnostic information is necessary to determine patient infection status.  The expected result is negative.  Fact Sheet for Patients:   RoadLapTop.co.za   Fact Sheet for Healthcare Providers:   http://kim-miller.com/    This test is not yet approved or cleared by the [place] FDA and  has been authorized for detection and/or diagnosis of SARS-CoV-2 by FDA under an Emergency Use Authorization (EUA).  This EUA will remain in effect (meaning this test can be used) for the duration of  the COVID-19 declaration under Section 564(b)(1)  of the Act, 21 U.S.C. section 360-bbb-3(b)(1), unless the authorization is terminated or revoked sooner.   Performed at [facility], [address] [place], [place] [address]      Labs: BNP (last 3 results) Recent Labs    06/28/23 2159  BNP 47.0   Basic Metabolic Panel: Recent Labs  Lab 06/28/23 2159 06/29/23 0434  NA 136 135  K 3.3* 3.4*  CL 100 105  CO2 22 24  GLUCOSE 239* 147*  BUN 20 17   CREATININE 2.22* 1.64*  CALCIUM 9.5 8.7*  MG  --  1.7   Liver Function Tests: No results for input(s): "AST", "ALT", "ALKPHOS", "BILITOT", "PROT", "ALBUMIN" in the last 168 hours. No results for input(s): "LIPASE", "AMYLASE" in the last 168 hours. No results for input(s): "AMMONIA" in the last 168 hours. CBC: Recent Labs  Lab 06/28/23 2159 06/29/23 0434  WBC 6.4 5.6  HGB 15.5 12.9*  HCT 45.8 39.1  MCV 96.8 99.5  PLT 174 141*   Cardiac Enzymes: No results for input(s): "CKTOTAL", "CKMB", "CKMBINDEX", "TROPONINI" in the last 168 hours. BNP: Invalid input(s): "POCBNP" CBG: Recent Labs  Lab 06/29/23 0038 06/29/23 0246 06/29/23 0749  GLUCAP 154* 171* 113*   D-Dimer No results for input(s): "DDIMER" in the last 72 hours. Hgb A1c No results for input(s): "HGBA1C" in the last 72 hours. Lipid Profile No results for input(s): "CHOL", "HDL", "LDLCALC", "TRIG", "CHOLHDL", "LDLDIRECT" in the last 72 hours. Thyroid function studies No results for input(s): "TSH", "T4TOTAL", "T3FREE", "THYROIDAB" in the last 72 hours.  Invalid input(s): "FREET3" Anemia work up No results for input(s): "VITAMINB12", "FOLATE", "FERRITIN", "TIBC", "IRON", "RETICCTPCT" in the last 72 hours. Urinalysis No results found for: "COLORURINE", "APPEARANCEUR", "LABSPEC", "PHURINE", "GLUCOSEU", "HGBUR", "BILIRUBINUR", "KETONESUR", "PROTEINUR", "UROBILINOGEN", "NITRITE", "LEUKOCYTESUR" Sepsis Labs Recent Labs  Lab 06/28/23 2159 06/29/23 0434  WBC 6.4 5.6   Microbiology Recent Results (from the past 240 hour(s))  SARS Coronavirus 2 by RT PCR (hospital order, performed in [facility] hospital lab) *cepheid single result test* Anterior Nasal Swab     Status: Abnormal   Collection Time: 06/28/23 10:22 PM   Specimen: Anterior Nasal Swab  Result Value Ref Range Status   SARS Coronavirus 2 by RT PCR POSITIVE (A) NEGATIVE Final    Comment: (NOTE) SARS-CoV-2 target nucleic acids are DETECTED  SARS-CoV-2 RNA  is generally detectable in upper respiratory specimens  during the acute phase of infection.  Positive results are indicative  of the presence of the identified virus, but do not rule out bacterial infection or co-infection with other pathogens not detected by the test.  Clinical correlation with patient history and  other diagnostic information is necessary to determine patient infection status.  The expected result is negative.  Fact Sheet for Patients:   RoadLapTop.co.za   Fact Sheet for Healthcare Providers:   http://kim-miller.com/    This test is not yet approved or cleared by the [place] and  has been authorized for detection and/or diagnosis of SARS-CoV-2 by FDA under an Emergency Use Authorization (EUA).  This EUA will remain in effect (meaning this test can be used) for the duration of  the COVID-19 declaration under Section 564(b)(1)  of the Act, 21 U.S.C. section 360-bbb-3(b)(1), unless the authorization is terminated or revoked sooner.   Performed at [facility], [address] [place], [place] [address]      Time coordinating discharge: 32 minutes  SIGNED:   Dorcas Carrow, MD  [facility] 06/29/2023, 8:39 AM

## 2023-06-29 NOTE — ED Notes (Signed)
ED TO INPATIENT HANDOFF REPORT  ED Nurse Name and Phone #: Raynelle Fanning, RN  S Name/Age/Gender Chad Perry 77 y.o. male Room/Bed: APA02/APA02  Code Status   Code Status: Full Code  Home/SNF/Other Home Patient oriented to: self, place, time, and situation Is this baseline? Yes   Triage Complete: Triage complete  Chief Complaint AKI (acute kidney injury) (HCC) [N17.9]  Triage Note Pt complains of chest pain and SOB after doing yard work. No n/v/d. Pt complains of chest and throat tightness. Pt had COVID last week.    Allergies No Known Allergies  Level of Care/Admitting Diagnosis ED Disposition     ED Disposition  Admit   Condition  --   Comment  Hospital Area: [facility] [[id number]]  Level of Care: Telemetry [5]  Covid Evaluation: Symptomatic Person Under Investigation (PUI) or recent exposure (last 10 days) *Testing Required*  Diagnosis: AKI (acute kidney injury) [facility]) [[id number]]  Admitting Physician: Briscoe Deutscher [[id number]]  Attending Physician: Briscoe Deutscher [[id number]]  Certification:: I certify this patient will need inpatient services for at least 2 midnights  Estimated Length of Stay: 3          B Medical/Surgery History Past Medical History:  Diagnosis Date   Back pain    CAD (coronary artery disease)    Diabetes mellitus without complication (HCC)    type 2   GERD (gastroesophageal reflux disease)    High cholesterol    Hypertension    Osteoarthritis    knee   Psoriasis    PTSD (post-traumatic stress disorder)    Past Surgical History:  Procedure Laterality Date   CARDIAC SURGERY  11/2016   triple bypass   ELBOW SURGERY     FEMUR FRACTURE SURGERY Right 1970   KNEE SURGERY Right 02/17/2018   replacement   WRIST SURGERY       A IV Location/Drains/Wounds Patient Lines/Drains/Airways Status     Active Line/Drains/Airways     Name Placement date Placement time Site Days   Peripheral IV 06/28/23 20 G Anterior;Left Forearm 06/28/23   2159  Forearm  1            Intake/Output Last 24 hours  Intake/Output Summary (Last 24 hours) at 06/29/2023 0158 Last data filed at 06/28/2023 2353 Gross per 24 hour  Intake 1000 ml  Output --  Net 1000 ml    Labs/Imaging Results for orders placed or performed during the hospital encounter of 06/28/23 (from the past 48 hour(s))  Lactic acid, plasma     Status: Abnormal   Collection Time: 06/28/23  9:58 PM  Result Value Ref Range   Lactic Acid, Venous 3.2 (HH) 0.5 - 1.9 mmol/L    Comment: CRITICAL RESULT CALLED TO, READ BACK BY AND VERIFIED WITH CHESTER,A @ 2242 ON 06/28/23 BY JUW Performed at [facility], [address], [place], [place] [address]   Basic metabolic panel     Status: Abnormal   Collection Time: 06/28/23  9:59 PM  Result Value Ref Range   Sodium 136 135 - 145 mmol/L   Potassium 3.3 (L) 3.5 - 5.1 mmol/L   Chloride 100 98 - 111 mmol/L   CO2 22 22 - 32 mmol/L   Glucose, Bld 239 (H) 70 - 99 mg/dL    Comment: Glucose reference range applies only to samples taken after fasting for at least 8 hours.   BUN 20 8 - 23 mg/dL   Creatinine, Ser 9.62 (H) 0.61 - 1.24 mg/dL   Calcium 9.5  8.9 - 10.3 mg/dL   GFR, Estimated 30 (L) >60 mL/min    Comment: (NOTE) Calculated using the CKD-EPI Creatinine Equation (2021)    Anion gap 14 5 - 15    Comment: Performed at [facility], [address] [place], [place] [address]  CBC     Status: None   Collection Time: 06/28/23  9:59 PM  Result Value Ref Range   WBC 6.4 4.0 - 10.5 K/uL   RBC 4.73 4.22 - 5.81 MIL/uL   Hemoglobin 15.5 13.0 - 17.0 g/dL   HCT 62.1 30.8 - 65.7 %   MCV 96.8 80.0 - 100.0 fL   MCH 32.8 26.0 - 34.0 pg   MCHC 33.8 30.0 - 36.0 g/dL   RDW 84.6 96.2 - 95.2 %   Platelets 174 150 - 400 K/uL   nRBC 0.0 0.0 - 0.2 %    Comment: Performed at [facility], [address] [place], [place] [address]  Troponin I (High Sensitivity)     Status: None   Collection Time: 06/28/23  9:59 PM  Result Value Ref Range    Troponin I (High Sensitivity) 17 <18 ng/L    Comment: (NOTE) Elevated high sensitivity troponin I (hsTnI) values and significant  changes across serial measurements may suggest ACS but many other  chronic and acute conditions are known to elevate hsTnI results.  Refer to the "Links" section for chest pain algorithms and additional  guidance. Performed at [facility], [address], [place], [place] [address]   Brain natriuretic peptide     Status: None   Collection Time: 06/28/23  9:59 PM  Result Value Ref Range   B Natriuretic Peptide 47.0 0.0 - 100.0 pg/mL    Comment: Performed at [facility], [address], [place], [place] [address]  SARS Coronavirus 2 by RT PCR (hospital order, performed in [facility] hospital lab) *cepheid single result test* Anterior Nasal Swab     Status: Abnormal   Collection Time: 06/28/23 10:22 PM   Specimen: Anterior Nasal Swab  Result Value Ref Range   SARS Coronavirus 2 by RT PCR POSITIVE (A) NEGATIVE    Comment: (NOTE) SARS-CoV-2 target nucleic acids are DETECTED  SARS-CoV-2 RNA is generally detectable in upper respiratory specimens  during the acute phase of infection.  Positive results are indicative  of the presence of the identified virus, but do not rule out bacterial infection or co-infection with other pathogens not detected by the test.  Clinical correlation with patient history and  other diagnostic information is necessary to determine patient infection status.  The expected result is negative.  Fact Sheet for Patients:   RoadLapTop.co.za   Fact Sheet for Healthcare Providers:   http://kim-miller.com/    This test is not yet approved or cleared by the [place] FDA and  has been authorized for detection and/or diagnosis of SARS-CoV-2 by FDA under an Emergency Use Authorization (EUA).  This EUA will remain in effect (meaning this test can be used) for the duration of  the COVID-19  declaration under Section 564(b)(1)  of the Act, 21 U.S.C. section 360-bbb-3(b)(1), unless the authorization is terminated or revoked sooner.   Performed at [facility], [address] [place], [place] [address]   Lactic acid, plasma     Status: Abnormal   Collection Time: 06/29/23 12:14 AM  Result Value Ref Range   Lactic Acid, Venous 3.3 (HH) 0.5 - 1.9 mmol/L    Comment: CRITICAL RESULT CALLED TO, READ BACK BY AND  VERIFIED WITH PRUITT,G @ 0036 ON 06/29/23 BY JUW Performed at [facility], [address], [place], [place] [address]   Troponin I (High Sensitivity)     Status: None   Collection Time: 06/29/23 12:14 AM  Result Value Ref Range   Troponin I (High Sensitivity) 15 <18 ng/L    Comment: (NOTE) Elevated high sensitivity troponin I (hsTnI) values and significant  changes across serial measurements may suggest ACS but many other  chronic and acute conditions are known to elevate hsTnI results.  Refer to the "Links" section for chest pain algorithms and additional  guidance. Performed at [facility], [address], [place], [place] [address]   CBG monitoring, ED     Status: Abnormal   Collection Time: 06/29/23 12:38 AM  Result Value Ref Range   Glucose-Capillary 154 (H) 70 - 99 mg/dL    Comment: Glucose reference range applies only to samples taken after fasting for at least 8 hours.   DG Chest 2 View  Result Date: 06/28/2023 CLINICAL DATA:  Chest pain EXAM: CHEST - 2 VIEW COMPARISON:  09/23/2021 FINDINGS: Cardiac shadow is within normal limits. Postsurgical changes are again seen. Lungs are well aerated bilaterally. No focal infiltrate or effusion is seen. No bony abnormality is noted. IMPRESSION: No active cardiopulmonary disease. Electronically Signed   By: Alcide Clever M.D.   On: 06/28/2023 22:25    Pending Labs Unresulted Labs (From admission, onward)     Start     Ordered   06/29/23 0500  Hemoglobin A1c  Tomorrow morning,   R       Comments: To assess prior  glycemic control    06/28/23 2355   06/29/23 0500  Magnesium  Tomorrow morning,   R        06/28/23 2355   06/29/23 0500  TSH  Tomorrow morning,   R        06/28/23 2355   06/29/23 0500  Basic metabolic panel  Daily,   R      06/28/23 2355   06/29/23 0500  CBC  Daily,   R      06/28/23 2355   06/29/23 0500  Lactic acid, plasma  (Lactic Acid)  Tomorrow morning,   R        06/29/23 0045            Vitals/Pain Today's Vitals   06/29/23 0045 06/29/23 0100 06/29/23 0115 06/29/23 0130  BP: 130/89 122/71 115/62 118/77  Pulse: 87 72 77 72  Resp: (!) 23 (!) 33 (!) 22 (!) 21  Temp:      TempSrc:      SpO2: 97% 96% 96% 96%  PainSc:        Isolation Precautions No active isolations  Medications Medications  atorvastatin (LIPITOR) tablet 80 mg (80 mg Oral Given 06/29/23 0043)  carvedilol (COREG) tablet 6.25 mg (has no administration in time range)  pantoprazole (PROTONIX) EC tablet 40 mg (has no administration in time range)  tamsulosin (FLOMAX) capsule 0.4 mg (has no administration in time range)  albuterol (PROVENTIL) (2.5 MG/3ML) 0.083% nebulizer solution 2.5 mg (has no administration in time range)  insulin aspart (novoLOG) injection 0-6 Units (has no administration in time range)  insulin aspart (novoLOG) injection 0-5 Units ( Subcutaneous Not Given 06/29/23 0039)  heparin injection 5,000 Units (has no administration in time range)  sodium chloride flush (NS) 0.9 % injection 3 mL (3 mLs Intravenous Given 06/29/23 0029)  acetaminophen (TYLENOL) tablet 650 mg (has no  administration in time range)    Or  acetaminophen (TYLENOL) suppository 650 mg (has no administration in time range)  ondansetron (ZOFRAN) tablet 4 mg (has no administration in time range)    Or  ondansetron (ZOFRAN) injection 4 mg (has no administration in time range)  lactated ringers infusion (0 mLs Intravenous Paused 06/29/23 0054)  aspirin EC tablet 81 mg (has no administration in time range)  sodium  chloride 0.9 % bolus 1,000 mL (0 mLs Intravenous Stopped 06/28/23 2353)  potassium chloride SA (KLOR-CON M) CR tablet 20 mEq (20 mEq Oral Given 06/29/23 0039)  sodium chloride 0.9 % bolus 1,000 mL (1,000 mLs Intravenous New Bag/Given 06/29/23 0054)    Mobility walks     Focused Assessments Pulmonary Assessment Handoff:  Lung sounds:   O2 Device: Room Air O2 Flow Rate (L/min): 0 L/min    R Recommendations: See Admitting Provider Note  Report given to:   Additional Notes: Pt is A&O x4. Lactic is elevated. Being admitted for AKI. Pt is Covid positive

## 2023-06-30 LAB — HEMOGLOBIN A1C
Hgb A1c MFr Bld: 7.5 % — ABNORMAL HIGH (ref 4.8–5.6)
Mean Plasma Glucose: 169 mg/dL

## 2023-07-12 ENCOUNTER — Other Ambulatory Visit: Payer: Self-pay | Admitting: Internal Medicine

## 2023-10-08 ENCOUNTER — Other Ambulatory Visit: Payer: Self-pay | Admitting: Internal Medicine

## 2023-11-05 ENCOUNTER — Other Ambulatory Visit: Payer: Self-pay | Admitting: Internal Medicine

## 2023-11-05 NOTE — Telephone Encounter (Signed)
This is a [place] pt.  °

## 2023-11-08 ENCOUNTER — Other Ambulatory Visit: Payer: Self-pay | Admitting: Internal Medicine

## 2024-03-22 ENCOUNTER — Other Ambulatory Visit: Payer: Self-pay | Admitting: Nurse Practitioner

## 2024-03-22 DIAGNOSIS — K769 Liver disease, unspecified: Secondary | ICD-10-CM

## 2024-04-02 ENCOUNTER — Ambulatory Visit
Admission: RE | Admit: 2024-04-02 | Discharge: 2024-04-02 | Disposition: A | Discharge status: Home / Self Care | Source: Ambulatory Visit | Attending: Nurse Practitioner | Admitting: Nurse Practitioner

## 2024-04-02 DIAGNOSIS — K769 Liver disease, unspecified: Secondary | ICD-10-CM

## 2024-04-25 ENCOUNTER — Other Ambulatory Visit: Payer: Self-pay | Admitting: Internal Medicine

## 2024-06-04 ENCOUNTER — Ambulatory Visit: Admitting: Internal Medicine

## 2024-08-23 ENCOUNTER — Ambulatory Visit: Attending: Internal Medicine | Admitting: Internal Medicine

## 2024-08-23 ENCOUNTER — Encounter: Payer: Self-pay | Admitting: Internal Medicine

## 2024-08-23 VITALS — BP 128/70 | HR 55 | Ht 66.0 in | Wt 211.0 lb

## 2024-08-23 DIAGNOSIS — I152 Hypertension secondary to endocrine disorders: Secondary | ICD-10-CM | POA: Diagnosis not present

## 2024-08-23 DIAGNOSIS — E1159 Type 2 diabetes mellitus with other circulatory complications: Secondary | ICD-10-CM | POA: Diagnosis not present

## 2024-08-23 DIAGNOSIS — Z7902 Long term (current) use of antithrombotics/antiplatelets: Secondary | ICD-10-CM | POA: Insufficient documentation

## 2024-08-23 NOTE — Patient Instructions (Signed)
 Medication Instructions:   Your physician recommends that you continue on your current medications as directed. Please refer to the Current Medication list given to you today.   Labwork: None today  Testing/Procedures: None today  Follow-Up: June 2026  Any Other Special Instructions Will Be Listed Below (If Applicable).  If you need a refill on your cardiac medications before your next appointment, please call your pharmacy.

## 2024-08-23 NOTE — Progress Notes (Addendum)
 Cardiology Office Note  Date: 08/23/2024   ID: Chad Perry, DOB 11-27-1945, MRN [id number]  PCP:  [facility]  Cardiologist:  Diannah SHAUNNA Maywood, MD Electrophysiologist:  None   Reason for Office Visit: Follow-up of CABG   History of Present Illness: Chad Chad is a 78 y.o. male known to have CAD s/p CABG in 2019 s/p LIMA-LAD, SVG-OM2, SVG-PDA, HTN, DM 2, GERD, orthostatic hypotension presents to the clinic for follow-up visit.  Patient reported exertional chest pain and DOE x since last year (after 02/2023) despite maximal anti-anginal therapy and underwent LHC at [facility] that showed significant stenosis just distal to LIMA-LAD anastomosis. S/p PCI of distal LAD via LIMA. No angina or DOE after PCI. No dizziness, syncope, leg swelling, palpitations.  Past Medical History:  Diagnosis Date   Back pain    CAD (coronary artery disease)    Diabetes mellitus without complication (HCC)    type 2   GERD (gastroesophageal reflux disease)    High cholesterol    Hypertension    Osteoarthritis    knee   Psoriasis    PTSD (post-traumatic stress disorder)     Past Surgical History:  Procedure Laterality Date   CARDIAC SURGERY  11/2016   triple bypass   ELBOW SURGERY     FEMUR FRACTURE SURGERY Right 1970   KNEE SURGERY Right 02/17/2018   replacement   WRIST SURGERY      Current Outpatient Medications  Medication Sig Dispense Refill   albuterol  (VENTOLIN  HFA) 108 (90 Base) MCG/ACT inhaler Inhale 2 puffs into the lungs every 4 (four) hours as needed for shortness of breath.     atorvastatin  (LIPITOR) 80 MG tablet Take 1 tablet by mouth at bedtime.     carvedilol  (COREG ) 6.25 MG tablet Take 1 tablet (6.25 mg total) by mouth 2 (two) times daily. 60 tablet 0   cetirizine (ZYRTEC) 10 MG tablet Take 10 mg by mouth daily as needed.     chlorthalidone (HYGROTON) 25 MG tablet Take 12.5 mg by mouth daily.     Cholecalciferol 50 MCG (2000 UT) TABS Take 1 tablet by  mouth daily.     ezetimibe  (ZETIA ) 10 MG tablet Take 1 tablet by mouth once daily 90 tablet 3   ferrous sulfate 325 (65 FE) MG tablet Take 325 mg by mouth every other day.     fluticasone-salmeterol (WIXELA INHUB) 100-50 MCG/ACT AEPB Inhale 1 puff into the lungs 2 (two) times daily.     Garlic 1000 MG CAPS Take 1 capsule by mouth daily.     hydrALAZINE  (APRESOLINE ) 100 MG tablet Take 1 tablet (100 mg total) by mouth 3 (three) times daily. 90 tablet 0   isosorbide  mononitrate (IMDUR ) 30 MG 24 hr tablet Take 1 tablet by mouth once daily 90 tablet 0   losartan (COZAAR) 100 MG tablet Take 100 mg by mouth daily.     metFORMIN (GLUCOPHAGE) 500 MG tablet Take 500 mg by mouth 2 (two) times daily with a meal.     omeprazole (PRILOSEC) 20 MG capsule Take 20 mg by mouth daily.     sertraline (ZOLOFT) 50 MG tablet TAKE ONE TABLET BY MOUTH EVERY DAY FOR PTSD, DEPRESSION     tamsulosin  (FLOMAX ) 0.4 MG CAPS capsule Take 0.4 mg by mouth daily.     No current facility-administered medications for this visit.   Allergies:  Patient has no known allergies.   Social History: The patient  reports that he has quit smoking.  He has never used smokeless tobacco. He reports that he does not drink alcohol and does not use drugs.   Family History: The patient's family history includes Diabetes in his sister; Prostate cancer in his father.   ROS:  Please see the history of present illness. Otherwise, complete review of systems is positive for none.  All other systems are reviewed and negative.   Physical Exam: VS:  There were no vitals taken for this visit., BMI There is no height or weight on file to calculate BMI.  Wt Readings from Last 3 Encounters:  06/29/23 214 lb 8.1 oz (97.3 kg)  03/17/23 212 lb (96.2 kg)  08/30/22 222 lb (100.7 kg)    General: Patient appears comfortable at rest. HEENT: Conjunctiva and lids normal, oropharynx clear with moist mucosa. Neck: Supple, no elevated JVP or carotid bruits, no  thyromegaly. Lungs: Clear to auscultation, nonlabored breathing at rest. Cardiac: Regular rate and rhythm, no S3 or significant systolic murmur, no pericardial rub. Abdomen: Soft, nontender, no hepatomegaly, bowel sounds present, no guarding or rebound. Extremities: No pitting edema, distal pulses 2+. Skin: Warm and dry. Musculoskeletal: No kyphosis. Neuropsychiatric: Alert and oriented x3, affect grossly appropriate.  Recent Labwork: No results found for requested labs within last 365 days.     Component Value Date/Time   CHOL 72 09/17/2022 1110   TRIG 89 09/17/2022 1110   HDL 28 (L) 09/17/2022 1110   CHOLHDL 2.6 09/17/2022 1110   VLDL 18 09/17/2022 1110   LDLCALC 26 09/17/2022 1110    Other Studies Reviewed Today: Echocardiogram in 2022 LVEF 50 to 55% No valve abnormalities  Assessment and Plan:  # CAD s/p CABG (LIMA-LAD, SVG-OM2, SVG-PDA) in 2019, s/p PCI of distal LAD via LIMA in 06/2024 - No angina/DOE after PCI. - Continue aspirin  81 mg once daily, Plavix 75 mg once daily. - Continue atorvastatin  80 mg nightly, Zetia  10 mg once daily. - He has an upcoming appointment with Dr. Shelah, [facility] in January 2026.  # HLD, at goal - Continue atorvastatin  80 mg nightly, Zetia  10 mg once daily.  LDL at goal.  Reviewed.  # HTN, controlled - Continue current antihypertensive medications, carvedilol  6.25 mg twice daily, chlorthalidone 12.5 mg once daily, hydralazine  100 mg twice daily, Imdur  30 mg once daily, losartan 25 mg once daily, metoprolol succinate 25 mg once daily.  Previously he was reluctant to make any changes to these medications.  Will continue for now.  # Type 2 diabetes Plan follow-up with PCP   I have spent a total of 30 minutes with patient reviewing chart, EKGs, labs and examining patient as well as establishing an assessment and plan that was discussed with the patient.  > 50% of time was spent in direct patient care.    Medication  Adjustments/Labs and Tests Ordered: Current medicines are reviewed at length with the patient today.  Concerns regarding medicines are outlined above.   Tests Ordered: No orders of the defined types were placed in this encounter.   Medication Changes: No orders of the defined types were placed in this encounter.   Disposition:  Follow up one year  Signed, Laketta Soderberg Arleta Maywood, MD, 08/23/2024 10:46 AM    [facility] [address] [place], [place] [address]

## 2024-10-31 ENCOUNTER — Other Ambulatory Visit: Payer: Self-pay | Admitting: Internal Medicine

## 2024-11-27 ENCOUNTER — Other Ambulatory Visit: Payer: Self-pay

## 2024-11-27 ENCOUNTER — Encounter (HOSPITAL_COMMUNITY): Payer: Self-pay

## 2024-11-27 ENCOUNTER — Emergency Department (HOSPITAL_COMMUNITY)
Admission: EM | Admit: 2024-11-27 | Discharge: 2024-11-27 | Disposition: A | Discharge status: Home / Self Care | Attending: Emergency Medicine | Admitting: Emergency Medicine

## 2024-11-27 DIAGNOSIS — H81399 Other peripheral vertigo, unspecified ear: Secondary | ICD-10-CM | POA: Diagnosis not present

## 2024-11-27 DIAGNOSIS — Z7982 Long term (current) use of aspirin: Secondary | ICD-10-CM | POA: Diagnosis not present

## 2024-11-27 DIAGNOSIS — R42 Dizziness and giddiness: Secondary | ICD-10-CM | POA: Diagnosis present

## 2024-11-27 DIAGNOSIS — Z7902 Long term (current) use of antithrombotics/antiplatelets: Secondary | ICD-10-CM | POA: Diagnosis not present

## 2024-11-27 MED ORDER — MECLIZINE HCL 25 MG PO TABS: 25.0000 mg | ORAL_TABLET | Freq: Three times a day (TID) | ORAL | 0 refills | Status: AC | PRN

## 2024-11-27 MED ORDER — ONDANSETRON 4 MG PO TBDP
4.0000 mg | ORAL_TABLET | Freq: Once | ORAL | Status: AC
  Administered 2024-11-27: 4 mg via ORAL
  Filled 2024-11-27: qty 1

## 2024-11-27 MED ORDER — MECLIZINE HCL 12.5 MG PO TABS
25.0000 mg | ORAL_TABLET | Freq: Once | ORAL | Status: AC
  Administered 2024-11-27: 25 mg via ORAL
  Filled 2024-11-27: qty 2

## 2024-11-27 MED ORDER — ONDANSETRON HCL 4 MG PO TABS: 4.0000 mg | ORAL_TABLET | Freq: Four times a day (QID) | ORAL | 0 refills | Status: AC

## 2024-11-27 NOTE — ED Triage Notes (Signed)
 Pt presents with 1 week of chills, cough, and generalized weakness. Today pt reports dizziness with N/V.

## 2024-11-27 NOTE — ED Provider Notes (Signed)
 " [place] EMERGENCY DEPARTMENT AT [facility] Provider Note   CSN: [id number] Arrival date & time: 11/27/24  2204     Patient presents with: Emesis   Chad Perry is a 79 y.o. male.    Emesis  This patient is a 79 year old male with a history of being sick last week with an upper respiratory illness with chills, headache, fever, coughing, that eventually went away and he is now feeling better however today after he got home from church he looked up and in so doing he noticed that he had acute onset of vertigo with the room spinning with associated nausea and vomiting.  Felt like he did not have his balance, he does have a slight ringing in his ear that is coming intermittently as well.  He has not lost any hearing and he denies any other neurologic symptoms, denies visual changes, he did have a brief few seconds worth of left-sided chest pain but that is completely gone away and he does not have it at this time.  When the patient holds perfectly still the dizziness goes away and he is not symptomatic at this time however when he does certain positions with his head it makes it come on almost immediately.    Prior to Admission medications  Medication Sig Start Date End Date Taking? Authorizing Provider  meclizine  (ANTIVERT ) 25 MG tablet Take 1 tablet (25 mg total) by mouth 3 (three) times daily as needed for dizziness. 11/27/24  Yes Cleotilde Rogue, MD  ondansetron  (ZOFRAN ) 4 MG tablet Take 1 tablet (4 mg total) by mouth every 6 (six) hours. 11/27/24  Yes Cleotilde Rogue, MD  albuterol  (VENTOLIN  HFA) 108 [phone] Base) MCG/ACT inhaler Inhale 2 puffs into the lungs every 4 (four) hours as needed for shortness of breath. 04/07/21   [provider]  aspirin  EC 81 MG tablet Take 81 mg by mouth daily. 01/24/23   [provider]  atorvastatin  (LIPITOR) 80 MG tablet Take 1 tablet by mouth at bedtime. 07/09/22   [provider]  carvedilol  (COREG ) 6.25 MG tablet Take 1 tablet  (6.25 mg total) by mouth 2 (two) times daily. 09/24/21 03/17/23  Shona Terry SAILOR, DO  cetirizine (ZYRTEC) 10 MG tablet Take 10 mg by mouth daily as needed. Patient not taking: Reported on 08/23/2024 08/06/22   [provider]  chlorthalidone (HYGROTON) 25 MG tablet Take 12.5 mg by mouth daily.    [provider]  Cholecalciferol 50 MCG (2000 UT) TABS Take 1 tablet by mouth daily. 01/24/23   [provider]  clopidogrel (PLAVIX) 75 MG tablet Take 75 mg by mouth. 06/21/24   [provider]  empagliflozin (JARDIANCE) 25 MG TABS tablet Take 25 mg by mouth every morning. 04/22/24   [provider]  ezetimibe  (ZETIA ) 10 MG tablet Take 1 tablet by mouth once daily 11/01/24   Mallipeddi, Vishnu P, MD  ferrous sulfate 325 (65 FE) MG tablet Take 325 mg by mouth every other day.    [provider]  fluticasone-salmeterol (WIXELA INHUB) 100-50 MCG/ACT AEPB Inhale 1 puff into the lungs 2 (two) times daily.    [provider]  Garlic 1000 MG CAPS Take 1 capsule by mouth daily.    [provider]  hydrALAZINE  (APRESOLINE ) 100 MG tablet Take 1 tablet (100 mg total) by mouth 3 (three) times daily. Patient taking differently: Take 100 mg by mouth 2 (two) times daily. 09/24/21 08/23/24  Shona Terry SAILOR, DO  isosorbide  mononitrate (IMDUR )  30 MG 24 hr tablet Take 1 tablet by mouth once daily 04/27/24   Mallipeddi, Vishnu P, MD  losartan (COZAAR) 25 MG tablet Take 25 mg by mouth daily.    [provider]  metFORMIN (GLUCOPHAGE) 500 MG tablet Take 500 mg by mouth 2 (two) times daily with a meal.    [provider]  metoprolol succinate (TOPROL-XL) 50 MG 24 hr tablet Take 25 mg by mouth daily. 04/15/24   [provider]  omeprazole (PRILOSEC) 20 MG capsule Take 20 mg by mouth daily.    [provider]  sertraline (ZOLOFT) 50 MG tablet TAKE ONE TABLET BY MOUTH EVERY DAY FOR PTSD, DEPRESSION Patient taking differently: Take 100 mg  by mouth daily. 02/05/23   [provider]  tamsulosin  (FLOMAX ) 0.4 MG CAPS capsule Take 0.4 mg by mouth daily.    [provider]    Allergies: Patient has no known allergies.    Review of Systems  Gastrointestinal:  Positive for vomiting.  All other systems reviewed and are negative.   Updated Vital Signs BP 139/88 (BP Location: Left Arm)   Pulse 66   Temp 97.9 F (36.6 C) (Oral)   Resp 20   Ht 1.676 m (5' 6)   Wt 95.7 kg   SpO2 94%   BMI 34.05 kg/m   Physical Exam Vitals and nursing note reviewed.  Constitutional:      General: He is not in acute distress.    Appearance: He is well-developed.  HENT:     Head: Normocephalic and atraumatic.     Mouth/Throat:     Pharynx: No oropharyngeal exudate.  Eyes:     General: No scleral icterus.       Right eye: No discharge.        Left eye: No discharge.     Conjunctiva/sclera: Conjunctivae normal.     Pupils: Pupils are equal, round, and reactive to light.  Neck:     Thyroid: No thyromegaly.     Vascular: No JVD.  Cardiovascular:     Rate and Rhythm: Normal rate and regular rhythm.     Heart sounds: Normal heart sounds. No murmur heard.    No friction rub. No gallop.  Pulmonary:     Effort: Pulmonary effort is normal. No respiratory distress.     Breath sounds: Normal breath sounds. No wheezing or rales.  Abdominal:     General: Bowel sounds are normal. There is no distension.     Palpations: Abdomen is soft. There is no mass.     Tenderness: There is no abdominal tenderness.  Musculoskeletal:        General: No tenderness. Normal range of motion.     Cervical back: Normal range of motion and neck supple.  Lymphadenopathy:     Cervical: No cervical adenopathy.  Skin:    General: Skin is warm and dry.     Findings: No erythema or rash.  Neurological:     Mental Status: He is alert.     Coordination: Coordination normal.     Comments: The patient has nystagmus when he goes from a laying to a  sitting position.  This fatigues over 1 to 2 minutes and goes back to normal.  Psychiatric:        Behavior: Behavior normal.     (all labs ordered are listed, but only abnormal results are displayed) Labs Reviewed - No data to display  EKG: EKG Interpretation Date/Time:  Saturday November 27 2024  22:23:56 EST Ventricular Rate:  58 PR Interval:  68 QRS Duration:  111 QT Interval:  465 QTC Calculation: 457 R Axis:   56  Text Interpretation: Sinus rhythm occasional Supraventricular bigeminy Short PR interval Probable left atrial enlargement Confirmed by Cleotilde Rogue ([id number]) on 11/27/2024 10:32:10 PM  Radiology: No results found.   Procedures   Medications Ordered in the ED  ondansetron  (ZOFRAN -ODT) disintegrating tablet 4 mg (4 mg Oral Given 11/27/24 2227)  meclizine  (ANTIVERT ) tablet 25 mg (25 mg Oral Given 11/27/24 2227)                                    Medical Decision Making Amount and/or Complexity of Data Reviewed ECG/medicine tests: ordered.  Risk Prescription drug management.   Neurologically this patient has a totally normal neurologic exam except for the nystagmus induced with head position.  He denies coughing or shortness of breath at this time though he has had a slight improvement in his coughing over time.  I suspect that he had a upper respiratory illness last week which was probably viral, this is left him with some residual peripheral vertigo, this does not appear to be central, it is inducible, rapid onset and fatigues over 1 to 2 minutes.  Vital signs are unremarkable, tympanic membrane's are clear bilaterally, this patient is otherwise stable will be given Zofran , meclizine , I will check an EKG due to the fleeting chest pain but he is not actively having chest pain at this time and this does not sound otherwise cardiac or (central neuro) neurologic.  EKG is unremarkable, patient improved with Zofran , no current symptoms at this time when he is not  moving, very suggestive of peripheral vertigo, no need for advanced neuroimaging     Final diagnoses:  Peripheral vertigo, unspecified laterality    ED Discharge Orders          Ordered    meclizine  (ANTIVERT ) 25 MG tablet  3 times daily PRN        11/27/24 2252    ondansetron  (ZOFRAN ) 4 MG tablet  Every 6 hours        11/27/24 2252               Cleotilde Rogue, MD 11/27/24 2254  "

## 2024-11-27 NOTE — ED Notes (Signed)
 Pt/family received d/c paperwork at this time. After going over the paperwork any questions, comments, or concerns were answered to the best of this nurse's knowledge. The pt/family verbally acknowledged the teachings/instructions.

## 2024-11-27 NOTE — Discharge Instructions (Addendum)
 Please read the instructions regarding vertigo, this is something that is usually self-limited and goes away by itself over a couple of days.  You will notice that you will get symptomatic with the dizziness or spinning feeling every time you try to change positions whether it is getting to a standing position laying down rolling over or bending over.  Please try to avoid movements and if you have to move do it very slowly to minimize the chance that this occurs.  You may take Zofran  every 6 hours as needed for nausea, meclizine  3 times a day as needed for dizziness, do not drive if taking this medication.  Meclizine  is a medication that can help with vertigo and dizziness.  You may take 1 tablet every 6 hours as needed.  Please make sure that you do not drive when you are taking this medication as it can make you sleepy.  Zofran  is a medication which can help with nausea.  You may take 4 mg by mouth every 6 hours as needed if you are an adult, if your child under the age of 6 take half of a tablet or 2 mg every 6 hours as needed.  This should dissolve on your tongue within a short timeframe.  Wait about 30 minutes after taking it to help with drinking clear liquids.  Return to the ER for severe or worsening symptoms but be aware that this may last for a couple of days.  You should see your doctor early this week if no better

## 2024-12-09 ENCOUNTER — Other Ambulatory Visit (HOSPITAL_COMMUNITY): Payer: Self-pay | Admitting: Nurse Practitioner

## 2024-12-09 DIAGNOSIS — D696 Thrombocytopenia, unspecified: Secondary | ICD-10-CM

## 2024-12-09 DIAGNOSIS — K7401 Hepatic fibrosis, early fibrosis: Secondary | ICD-10-CM

## 2024-12-09 DIAGNOSIS — K7581 Nonalcoholic steatohepatitis (NASH): Secondary | ICD-10-CM

## 2024-12-23 ENCOUNTER — Other Ambulatory Visit (HOSPITAL_COMMUNITY)
Admission: RE | Admit: 2024-12-23 | Discharge: 2024-12-23 | Disposition: A | Source: Ambulatory Visit | Attending: Nurse Practitioner | Admitting: Nurse Practitioner

## 2024-12-23 LAB — HEPATIC FUNCTION PANEL
ALT: 22 U/L (ref 0–44)
AST: 37 U/L (ref 15–41)
Albumin: 4.4 g/dL (ref 3.5–5.0)
Alkaline Phosphatase: 82 U/L (ref 38–126)
Bilirubin, Direct: 0.3 mg/dL — ABNORMAL HIGH (ref 0.0–0.2)
Indirect Bilirubin: 0.3 mg/dL (ref 0.3–0.9)
Total Bilirubin: 0.6 mg/dL (ref 0.0–1.2)
Total Protein: 8.1 g/dL (ref 6.5–8.1)

## 2024-12-23 LAB — CBC
HCT: 44.7 % (ref 39.0–52.0)
Hemoglobin: 15 g/dL (ref 13.0–17.0)
MCH: 33.2 pg (ref 26.0–34.0)
MCHC: 33.6 g/dL (ref 30.0–36.0)
MCV: 98.9 fL (ref 80.0–100.0)
Platelets: 156 10*3/uL (ref 150–400)
RBC: 4.52 MIL/uL (ref 4.22–5.81)
RDW: 13.1 % (ref 11.5–15.5)
WBC: 4.8 10*3/uL (ref 4.0–10.5)
nRBC: 0 % (ref 0.0–0.2)

## 2024-12-23 LAB — HEPATITIS A ANTIBODY, IGM: Hep A IgM: NONREACTIVE

## 2024-12-23 LAB — VITAMIN B12: Vitamin B-12: 394 pg/mL (ref 180–914)

## 2024-12-23 LAB — PROTIME-INR
INR: 0.9 (ref 0.8–1.2)
Prothrombin Time: 12.6 s (ref 11.4–15.2)

## 2024-12-23 LAB — FOLATE: Folate: 13.1 ng/mL

## 2024-12-24 LAB — MISC LABCORP TEST (SEND OUT)
Labcorp test code: 144473
Labcorp test code: 550659
